# Patient Record
Sex: Male | Born: 1960 | Race: Black or African American | Hispanic: No | Marital: Married | Smoking: Current every day smoker
Health system: Southern US, Community
[De-identification: ages and names within clinical notes are randomized; demographics above are authoritative.]

## PROBLEM LIST (undated history)

## (undated) DIAGNOSIS — M1990 Unspecified osteoarthritis, unspecified site: Secondary | ICD-10-CM

## (undated) DIAGNOSIS — J189 Pneumonia, unspecified organism: Secondary | ICD-10-CM

## (undated) DIAGNOSIS — E785 Hyperlipidemia, unspecified: Secondary | ICD-10-CM

---

## 2010-03-14 HISTORY — PX: OTHER SURGICAL HISTORY: SHX169

## 2011-11-25 NOTE — H&P (Signed)
   Brent Garrett is an 51 y.o. male.    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 male complaining of left knee medial compartment pain since having an incident at work. Originally the pain was thought to be only from an ACL tear, which he did have repaired.  His pain continued and he had crunching on the medial aspect of the knee with certain motions.  Pain had continually increased since the beginning. X-rays in the clinic show end-stage arthritic changes of the medial compartment of the left knee. Pt has tried various conservative treatments which have failed to alleviate their symptoms, including injections and NSAIDs. Various options are discussed with the patient. Risks, benefits and expectations were discussed with the patient. Patient understand the risks, benefits and expectations and wishes to proceed with surgery.   PCP:  No primary provider on file.  D/C Plans:  Home with HHPT  Post-op Meds:   Rx given for ASA, Robaxin, Celebrex, Iron, Colace and MiraLax  Tranexamic Acid:   To be given  Decadron:   To be given  PMH: Recent episode of pneumonia which has resolved  PSH: No past surgical history on file.  Social History: Smokes tobacco 1 pack per day for 30 years, discussion is had regarding quitting and the adverse health effects of continues cigarette smoking. Occasional use of alcohol.  Allergies:  No know drug allergies  Medications: No current outpatient prescriptions  ROS: Review of Systems  Constitutional: Negative.   HENT: Negative.   Eyes: Negative.   Respiratory: Negative.   Cardiovascular: Negative.   Gastrointestinal: Negative.   Genitourinary: Negative.   Musculoskeletal: Positive for joint pain.  Skin: Negative.   Neurological: Negative.   Endo/Heme/Allergies: Positive for environmental allergies.  Psychiatric/Behavioral: Negative.      Physical Exam: BP:   119/80  ;  HR:   83  ; Resp:   16  ; Physical  Exam  Constitutional: He is oriented to person, place, and time and well-developed, well-nourished, and in no distress.  HENT:  Head: Normocephalic and atraumatic.  Nose: Nose normal.  Mouth/Throat: Oropharynx is clear and moist.  Eyes: Pupils are equal, round, and reactive to light.  Neck: Neck supple. No JVD present. No tracheal deviation present. No thyromegaly present.  Cardiovascular: Normal rate, regular rhythm, normal heart sounds and intact distal pulses.   Pulmonary/Chest: Effort normal and breath sounds normal. No stridor. No respiratory distress. He has no wheezes.  Abdominal: Soft. There is no tenderness. There is no guarding.  Musculoskeletal:       Left knee: He exhibits decreased range of motion, swelling and bony tenderness. He exhibits no effusion, no ecchymosis, no deformity, no laceration and no erythema. tenderness found. Medial joint line tenderness noted. No lateral joint line tenderness noted.  Lymphadenopathy:    He has no cervical adenopathy.  Neurological: He is alert and oriented to person, place, and time.  Skin: Skin is warm and dry.  Psychiatric: Affect normal.     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 Charlann Boxer at [facility]. Risks benefits and expectations were discussed with the patient. Patient understand risks, benefits and expectations and wishes to proceed.   Anastasio Auerbach Erice Ahles   PAC  11/25/2011, 2:06 PM

## 2011-11-29 ENCOUNTER — Encounter (HOSPITAL_COMMUNITY): Payer: Self-pay | Admitting: Pharmacy Technician

## 2011-12-02 ENCOUNTER — Inpatient Hospital Stay (HOSPITAL_COMMUNITY): Admission: RE | Admit: 2011-12-02 | Payer: Self-pay | Source: Ambulatory Visit

## 2012-01-04 NOTE — H&P (Signed)
  Brent Garrett is an 51 y.o. male.   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 male complaining of left knee medial compartment pain since having an incident at work. Originally the pain was thought to be only from an ACL tear, which he did have repaired. His pain continued and he had crunching on the medial aspect of the knee with certain motions. Pain had continually increased since the beginning. X-rays in the clinic show end-stage arthritic changes of the medial compartment of the left knee. Pt has tried various conservative treatments which have failed to alleviate their symptoms, including injections and NSAIDs. Various options are discussed with the patient. Risks, benefits and expectations were discussed with the patient. Patient understand the risks, benefits and expectations and wishes to proceed with surgery. Was originally suppose to have surgery a month ago, but because of the pneumonia episode it was rescheduled. He states nothing has changed with his knee and he is ready for surgery  PCP: No primary provider on file.   D/C Plans: Home with HHPT   Post-op Meds: Rx given for ASA, Robaxin, Celebrex, Iron, Colace and MiraLax   Tranexamic Acid: To be given   Decadron: To be given   PMH:  Episode of pneumonia over 1 month ago, which has resolved   PSH:  No past surgical history on file.   Social History: Smokes tobacco 1 pack per day for 30 years, discussion is had regarding quitting and the adverse health effects of continues cigarette smoking. Occasional use of alcohol.   Allergies:  No know drug allergies   Medications:  No current outpatient prescriptions   ROS:  Review of Systems  Constitutional: Negative.  HENT: Negative.  Eyes: Negative.  Respiratory: Negative.  Cardiovascular: Negative.  Gastrointestinal: Negative.  Genitourinary: Negative.  Musculoskeletal: Positive for joint pain.  Skin: Negative.  Neurological:  Negative.  Endo/Heme/Allergies: Positive for environmental allergies.  Psychiatric/Behavioral: Negative.    Physical Exam:  BP: 126/78 ; HR: 75 ; Resp: 16 ;  Physical Exam  Constitutional: He is oriented to person, place, and time and well-developed, well-nourished, and in no distress.  HENT:  Head: Normocephalic and atraumatic.  Nose: Nose normal.  Mouth/Throat: Oropharynx is clear and moist.  Eyes: Pupils are equal, round, and reactive to light.  Neck: Neck supple. No JVD present. No tracheal deviation present. No thyromegaly present.  Cardiovascular: Normal rate, regular rhythm, normal heart sounds and intact distal pulses.  Pulmonary/Chest: Effort normal and breath sounds normal. No stridor. No respiratory distress. He has no wheezes.  Abdominal: Soft. There is no tenderness. There is no guarding.  Musculoskeletal:  Left knee: He exhibits decreased range of motion, swelling and bony tenderness. He exhibits no effusion, no ecchymosis, no deformity, no laceration and no erythema. tenderness found. Medial joint line tenderness noted. No lateral joint line tenderness noted.  Lymphadenopathy:  He has no cervical adenopathy.  Neurological: He is alert and oriented to person, place, and time.  Skin: Skin is warm and dry.  Psychiatric: Affect normal.   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 Charlann Boxer at [facility]. Risks benefits and expectations were discussed with the patient. Patient understand risks, benefits and expectations and wishes to proceed.   Anastasio Auerbach Leia Coletti   PAC  01/04/2012, 4:38 PM

## 2012-01-05 ENCOUNTER — Encounter (HOSPITAL_COMMUNITY)
Admission: RE | Admit: 2012-01-05 | Discharge: 2012-01-05 | Disposition: A | Discharge status: Home / Self Care | Payer: Self-pay | Source: Ambulatory Visit | Attending: Orthopedic Surgery | Admitting: Orthopedic Surgery

## 2012-01-05 ENCOUNTER — Ambulatory Visit (HOSPITAL_COMMUNITY)
Admission: RE | Admit: 2012-01-05 | Discharge: 2012-01-05 | Disposition: A | Discharge status: Home / Self Care | Payer: Self-pay | Source: Ambulatory Visit | Attending: Orthopedic Surgery | Admitting: Orthopedic Surgery

## 2012-01-05 ENCOUNTER — Encounter (HOSPITAL_COMMUNITY): Payer: Self-pay

## 2012-01-05 DIAGNOSIS — IMO0002 Reserved for concepts with insufficient information to code with codable children: Secondary | ICD-10-CM | POA: Insufficient documentation

## 2012-01-05 DIAGNOSIS — Z01818 Encounter for other preprocedural examination: Secondary | ICD-10-CM | POA: Insufficient documentation

## 2012-01-05 DIAGNOSIS — Z01812 Encounter for preprocedural laboratory examination: Secondary | ICD-10-CM | POA: Insufficient documentation

## 2012-01-05 DIAGNOSIS — M1710 Unilateral primary osteoarthritis, unspecified knee: Secondary | ICD-10-CM | POA: Insufficient documentation

## 2012-01-05 HISTORY — DX: Pneumonia, unspecified organism: J18.9

## 2012-01-05 HISTORY — DX: Unspecified osteoarthritis, unspecified site: M19.90

## 2012-01-05 LAB — URINALYSIS, ROUTINE W REFLEX MICROSCOPIC
Bilirubin Urine: NEGATIVE
Nitrite: NEGATIVE
Protein, ur: NEGATIVE mg/dL
Specific Gravity, Urine: 1.018 (ref 1.005–1.030)
Urobilinogen, UA: 0.2 mg/dL (ref 0.0–1.0)

## 2012-01-05 LAB — CBC
Hemoglobin: 14.6 g/dL (ref 13.0–17.0)
MCHC: 33.9 g/dL (ref 30.0–36.0)
RBC: 5.45 MIL/uL (ref 4.22–5.81)

## 2012-01-05 LAB — URINE MICROSCOPIC-ADD ON

## 2012-01-05 LAB — SURGICAL PCR SCREEN
MRSA, PCR: NEGATIVE
Staphylococcus aureus: NEGATIVE

## 2012-01-05 LAB — BASIC METABOLIC PANEL
BUN: 11 mg/dL (ref 6–23)
GFR calc Af Amer: >90 mL/min (ref >90)
GFR calc non Af Amer: 81 mL/min — ABNORMAL LOW (ref >90)
Potassium: 4.3 mEq/L (ref 3.5–5.1)
Sodium: 138 mEq/L (ref 135–145)

## 2012-01-05 LAB — PROTIME-INR
INR: 0.95 (ref 0.00–1.49)
Prothrombin Time: 12.6 seconds (ref 11.6–15.2)

## 2012-01-05 NOTE — Pre-Procedure Instructions (Signed)
PREOP CBC, BMET, PT, PTT, UA, CXR WERE DONE TODAY AT [facility] AS PER ORDERS DR. OLIN AND ANESTHESIOLOGIST.  PT HAD PNEUMONIA SEPT 2013 AND HAD NOT HAD FOLLOW UP CXR-BUT REPORTS FEELING OK NOW-NO FEVER OR COUGH.  PT WANTS TO WAIT UNTIL DAY OF SURGERY TO DO T/S-DID NOT WANT TO WEAR BLOOD BRACELET.

## 2012-01-05 NOTE — Pre-Procedure Instructions (Signed)
PT'S PREOP  ABNORMAL URINALYSIS AND URINE MICROSCOPIC REPORTS FAXED TO DR. Nilsa Nutting OFFICE VIA EP[employer] FAX.

## 2012-01-05 NOTE — Patient Instructions (Signed)
YOUR SURGERY IS SCHEDULED AT [facility]  ON:  Tuesday  10/29  AT 10:30 AM  REPORT TO [facility] AT:  8:00 AM      PHONE # FOR SHORT STAY IS [phone]  DO NOT EAT OR DRINK ANYTHING AFTER MIDNIGHT THE NIGHT BEFORE YOUR SURGERY.  YOU MAY BRUSH YOUR TEETH, RINSE OUT YOUR MOUTH--BUT NO WATER, NO FOOD, NO CHEWING GUM, NO MINTS, NO CANDIES, NO CHEWING TOBACCO.  PLEASE TAKE THE FOLLOWING MEDICATIONS THE AM OF YOUR SURGERY WITH A FEW SIPS OF WATER:  NO MEDICINES TO TAKE   IF YOU USE INHALERS--USE YOUR INHALERS THE AM OF YOUR SURGERY AND BRING INHALERS TO THE HOSPITAL -TAKE TO SURGERY.    IF YOU ARE DIABETIC:  DO NOT TAKE ANY DIABETIC MEDICATIONS THE AM OF YOUR SURGERY.  IF YOU TAKE INSULIN IN THE EVENINGS--PLEASE ONLY TAKE 1/2 NORMAL EVENING DOSE THE NIGHT BEFORE YOUR SURGERY.  NO INSULIN THE AM OF YOUR SURGERY.  IF YOU HAVE SLEEP APNEA AND USE CPAP OR BIPAP--PLEASE BRING THE MASK AND THE TUBING.  DO NOT BRING YOUR MACHINE.  DO NOT BRING VALUABLES, MONEY, CREDIT CARDS.  DO NOT WEAR JEWELRY, MAKE-UP, NAIL POLISH AND NO METAL PINS OR CLIPS IN YOUR HAIR. CONTACT LENS, DENTURES / PARTIALS, GLASSES SHOULD NOT BE WORN TO SURGERY AND IN MOST CASES-HEARING AIDS WILL NEED TO BE REMOVED.  BRING YOUR GLASSES CASE, ANY EQUIPMENT NEEDED FOR YOUR CONTACT LENS. FOR PATIENTS ADMITTED TO THE HOSPITAL--CHECK OUT TIME THE DAY OF DISCHARGE IS 11:00 AM.  ALL INPATIENT ROOMS ARE PRIVATE - WITH BATHROOM, TELEPHONE, TELEVISION AND WIFI INTERNET.  IF YOU ARE BEING DISCHARGED THE SAME DAY OF YOUR SURGERY--YOU CAN NOT DRIVE YOURSELF HOME--AND SHOULD NOT GO HOME ALONE BY TAXI OR BUS.  NO DRIVING OR OPERATING MACHINERY FOR 24 HOURS FOLLOWING ANESTHESIA / PAIN MEDICATIONS.  PLEASE MAKE ARRANGEMENTS FOR SOMEONE TO BE WITH YOU AT HOME THE FIRST 24 HOURS AFTER SURGERY. RESPONSIBLE DRIVER'S NAME___________________________                                               PHONE #   _______________________                                    PLEASE READ OVER ANY  FACT SHEETS THAT YOU WERE GIVEN: MRSA INFORMATION, BLOOD TRANSFUSION INFORMATION, INCENTIVE SPIROMETER INFORMATION.

## 2012-01-10 ENCOUNTER — Ambulatory Visit (HOSPITAL_COMMUNITY): Payer: Worker's Compensation | Admitting: Anesthesiology

## 2012-01-10 ENCOUNTER — Observation Stay (HOSPITAL_COMMUNITY)
Admission: RE | Admit: 2012-01-10 | Discharge: 2012-01-11 | Disposition: A | Discharge status: Home / Self Care | Payer: Worker's Compensation | Source: Ambulatory Visit | Attending: Orthopedic Surgery | Admitting: Orthopedic Surgery

## 2012-01-10 ENCOUNTER — Encounter (HOSPITAL_COMMUNITY): Payer: Self-pay | Admitting: *Deleted

## 2012-01-10 ENCOUNTER — Encounter (HOSPITAL_COMMUNITY)
Admission: RE | Disposition: A | Discharge status: Home / Self Care | Payer: Self-pay | Source: Ambulatory Visit | Attending: Orthopedic Surgery

## 2012-01-10 ENCOUNTER — Encounter (HOSPITAL_COMMUNITY): Payer: Self-pay | Admitting: Anesthesiology

## 2012-01-10 DIAGNOSIS — E871 Hypo-osmolality and hyponatremia: Secondary | ICD-10-CM

## 2012-01-10 DIAGNOSIS — M25569 Pain in unspecified knee: Secondary | ICD-10-CM | POA: Insufficient documentation

## 2012-01-10 DIAGNOSIS — Z96652 Presence of left artificial knee joint: Secondary | ICD-10-CM

## 2012-01-10 DIAGNOSIS — M1710 Unilateral primary osteoarthritis, unspecified knee: Principal | ICD-10-CM | POA: Insufficient documentation

## 2012-01-10 DIAGNOSIS — Z8701 Personal history of pneumonia (recurrent): Secondary | ICD-10-CM | POA: Insufficient documentation

## 2012-01-10 DIAGNOSIS — F17200 Nicotine dependence, unspecified, uncomplicated: Secondary | ICD-10-CM | POA: Insufficient documentation

## 2012-01-10 HISTORY — PX: PARTIAL KNEE ARTHROPLASTY: SHX2174

## 2012-01-10 LAB — TYPE AND SCREEN: ABO/RH(D): O POS

## 2012-01-10 LAB — ABO/RH: ABO/RH(D): O POS

## 2012-01-10 SURGERY — ARTHROPLASTY, KNEE, UNICOMPARTMENTAL
Anesthesia: Spinal | Site: Knee | Laterality: Left | Wound class: Clean

## 2012-01-10 MED ORDER — METOCLOPRAMIDE HCL 5 MG/ML IJ SOLN: 5.0000 mg | Freq: Three times a day (TID) | INTRAMUSCULAR | Status: DC | PRN

## 2012-01-10 MED ORDER — KETOROLAC TROMETHAMINE 30 MG/ML IJ SOLN
INTRAMUSCULAR | Status: DC | PRN
  Administered 2012-01-10: 30 mg via INTRAMUSCULAR

## 2012-01-10 MED ORDER — HYDROMORPHONE HCL PF 1 MG/ML IJ SOLN
0.5000 mg | INTRAMUSCULAR | Status: DC | PRN
  Administered 2012-01-10: 1 mg via INTRAVENOUS
  Filled 2012-01-10: qty 1

## 2012-01-10 MED ORDER — BUPIVACAINE-EPINEPHRINE 0.25% -1:200000 IJ SOLN
INTRAMUSCULAR | Status: DC | PRN
  Administered 2012-01-10: 30 mL

## 2012-01-10 MED ORDER — LACTATED RINGERS IV SOLN: INTRAVENOUS | Status: DC

## 2012-01-10 MED ORDER — MAGNESIUM CITRATE PO SOLN: 0.5000 | Freq: Once | ORAL | Status: AC | PRN

## 2012-01-10 MED ORDER — LACTATED RINGERS IV SOLN
INTRAVENOUS | Status: DC | PRN
  Administered 2012-01-10 (×3): via INTRAVENOUS

## 2012-01-10 MED ORDER — HYDROMORPHONE HCL PF 1 MG/ML IJ SOLN: 0.2500 mg | INTRAMUSCULAR | Status: DC | PRN

## 2012-01-10 MED ORDER — METHOCARBAMOL 500 MG PO TABS
500.0000 mg | ORAL_TABLET | Freq: Four times a day (QID) | ORAL | Status: DC | PRN
  Administered 2012-01-10: 500 mg via ORAL

## 2012-01-10 MED ORDER — ACETAMINOPHEN 650 MG RE SUPP: 650.0000 mg | Freq: Four times a day (QID) | RECTAL | Status: DC | PRN

## 2012-01-10 MED ORDER — CEFAZOLIN SODIUM-DEXTROSE 2-3 GM-% IV SOLR
2.0000 g | Freq: Four times a day (QID) | INTRAVENOUS | Status: AC
  Administered 2012-01-10 (×2): 2 g via INTRAVENOUS
  Filled 2012-01-10 (×2): qty 50

## 2012-01-10 MED ORDER — ACETAMINOPHEN 10 MG/ML IV SOLN
INTRAVENOUS | Status: DC | PRN
  Administered 2012-01-10: 1000 mg via INTRAVENOUS

## 2012-01-10 MED ORDER — HYDROCODONE-ACETAMINOPHEN 7.5-325 MG PO TABS
1.0000 | ORAL_TABLET | Freq: Four times a day (QID) | ORAL | Status: DC
  Administered 2012-01-10 – 2012-01-11 (×5): 1 via ORAL
  Filled 2012-01-10: qty 1
  Filled 2012-01-10: qty 2
  Filled 2012-01-10 (×3): qty 1

## 2012-01-10 MED ORDER — 0.9 % SODIUM CHLORIDE (POUR BTL) OPTIME
TOPICAL | Status: DC | PRN
  Administered 2012-01-10: 1000 mL

## 2012-01-10 MED ORDER — FENTANYL CITRATE 0.05 MG/ML IJ SOLN
INTRAMUSCULAR | Status: DC | PRN
  Administered 2012-01-10: 100 ug via INTRAVENOUS

## 2012-01-10 MED ORDER — DOCUSATE SODIUM 100 MG PO CAPS
100.0000 mg | ORAL_CAPSULE | Freq: Two times a day (BID) | ORAL | Status: DC
  Administered 2012-01-10 – 2012-01-11 (×3): 100 mg via ORAL

## 2012-01-10 MED ORDER — ACETAMINOPHEN 10 MG/ML IV SOLN
INTRAVENOUS | Status: AC
  Filled 2012-01-10: qty 100

## 2012-01-10 MED ORDER — DEXAMETHASONE SODIUM PHOSPHATE 10 MG/ML IJ SOLN
10.0000 mg | Freq: Once | INTRAMUSCULAR | Status: AC
  Administered 2012-01-10: 10 mg via INTRAVENOUS

## 2012-01-10 MED ORDER — PROMETHAZINE HCL 25 MG/ML IJ SOLN: 6.2500 mg | INTRAMUSCULAR | Status: DC | PRN

## 2012-01-10 MED ORDER — CELECOXIB 200 MG PO CAPS
200.0000 mg | ORAL_CAPSULE | Freq: Two times a day (BID) | ORAL | Status: DC
  Administered 2012-01-10 – 2012-01-11 (×3): 200 mg via ORAL
  Filled 2012-01-10 (×4): qty 1

## 2012-01-10 MED ORDER — BUPIVACAINE IN DEXTROSE 0.75-8.25 % IT SOLN
INTRATHECAL | Status: DC | PRN
  Administered 2012-01-10: 2 mL via INTRATHECAL

## 2012-01-10 MED ORDER — ONDANSETRON HCL 4 MG/2ML IJ SOLN: 4.0000 mg | Freq: Four times a day (QID) | INTRAMUSCULAR | Status: DC | PRN

## 2012-01-10 MED ORDER — BUPIVACAINE-EPINEPHRINE PF 0.25-1:200000 % IJ SOLN
INTRAMUSCULAR | Status: AC
  Filled 2012-01-10: qty 30

## 2012-01-10 MED ORDER — METOCLOPRAMIDE HCL 5 MG PO TABS
5.0000 mg | ORAL_TABLET | Freq: Three times a day (TID) | ORAL | Status: DC | PRN
  Filled 2012-01-10: qty 2

## 2012-01-10 MED ORDER — DEXAMETHASONE SODIUM PHOSPHATE 10 MG/ML IJ SOLN
10.0000 mg | Freq: Once | INTRAMUSCULAR | Status: AC
  Administered 2012-01-11: 10 mg via INTRAVENOUS
  Filled 2012-01-10: qty 1

## 2012-01-10 MED ORDER — ONDANSETRON HCL 4 MG/2ML IJ SOLN
INTRAMUSCULAR | Status: DC | PRN
  Administered 2012-01-10: 4 mg via INTRAVENOUS

## 2012-01-10 MED ORDER — CEFAZOLIN SODIUM-DEXTROSE 2-3 GM-% IV SOLR
2.0000 g | INTRAVENOUS | Status: AC
  Administered 2012-01-10: 2 g via INTRAVENOUS

## 2012-01-10 MED ORDER — ONDANSETRON HCL 4 MG PO TABS: 4.0000 mg | ORAL_TABLET | Freq: Four times a day (QID) | ORAL | Status: DC | PRN

## 2012-01-10 MED ORDER — POLYETHYLENE GLYCOL 3350 17 G PO PACK
17.0000 g | PACK | Freq: Two times a day (BID) | ORAL | Status: DC
  Administered 2012-01-10 – 2012-01-11 (×3): 17 g via ORAL

## 2012-01-10 MED ORDER — MIDAZOLAM HCL 5 MG/5ML IJ SOLN
INTRAMUSCULAR | Status: DC | PRN
  Administered 2012-01-10: 2 mg via INTRAVENOUS

## 2012-01-10 MED ORDER — MENTHOL 3 MG MT LOZG: 1.0000 | LOZENGE | OROMUCOSAL | Status: DC | PRN

## 2012-01-10 MED ORDER — POTASSIUM CHLORIDE 2 MEQ/ML IV SOLN
INTRAVENOUS | Status: DC
  Administered 2012-01-10 – 2012-01-11 (×2): via INTRAVENOUS
  Filled 2012-01-10 (×7): qty 1000

## 2012-01-10 MED ORDER — RIVAROXABAN 10 MG PO TABS
10.0000 mg | ORAL_TABLET | Freq: Every day | ORAL | Status: DC
  Administered 2012-01-11: 10 mg via ORAL
  Filled 2012-01-10 (×2): qty 1

## 2012-01-10 MED ORDER — ACETAMINOPHEN 325 MG PO TABS: 650.0000 mg | ORAL_TABLET | Freq: Four times a day (QID) | ORAL | Status: DC | PRN

## 2012-01-10 MED ORDER — CHLORHEXIDINE GLUCONATE 4 % EX LIQD
60.0000 mL | Freq: Once | CUTANEOUS | Status: DC
  Filled 2012-01-10: qty 60

## 2012-01-10 MED ORDER — PHENOL 1.4 % MT LIQD: 1.0000 | OROMUCOSAL | Status: DC | PRN

## 2012-01-10 MED ORDER — PROPOFOL INFUSION 10 MG/ML OPTIME
INTRAVENOUS | Status: DC | PRN
  Administered 2012-01-10: 100 ug/kg/min via INTRAVENOUS

## 2012-01-10 MED ORDER — KETOROLAC TROMETHAMINE 30 MG/ML IJ SOLN
INTRAMUSCULAR | Status: AC
  Filled 2012-01-10: qty 1

## 2012-01-10 MED ORDER — CEFAZOLIN SODIUM-DEXTROSE 2-3 GM-% IV SOLR
INTRAVENOUS | Status: AC
  Filled 2012-01-10: qty 50

## 2012-01-10 MED ORDER — METHOCARBAMOL 100 MG/ML IJ SOLN
500.0000 mg | Freq: Four times a day (QID) | INTRAVENOUS | Status: DC | PRN
  Filled 2012-01-10: qty 5

## 2012-01-10 MED ORDER — TRANEXAMIC ACID 100 MG/ML IV SOLN
1150.0000 mg | Freq: Once | INTRAVENOUS | Status: AC
  Administered 2012-01-10 (×2): 1150 mg via INTRAVENOUS
  Filled 2012-01-10: qty 11.5

## 2012-01-10 SURGICAL SUPPLY — 47 items
BAG ZIPLOCK 12X15 (MISCELLANEOUS) ×2 IMPLANT
BANDAGE ELASTIC 6 VELCRO ST LF (GAUZE/BANDAGES/DRESSINGS) ×2 IMPLANT
BANDAGE ESMARK 6X9 LF (GAUZE/BANDAGES/DRESSINGS) ×1 IMPLANT
BLADE SAW RECIPROCATING 77.5 (BLADE) ×2 IMPLANT
BLADE SAW SGTL 13.0X1.19X90.0M (BLADE) ×2 IMPLANT
BNDG ESMARK 6X9 LF (GAUZE/BANDAGES/DRESSINGS) ×2
BOWL SMART MIX CTS (DISPOSABLE) ×2 IMPLANT
CEMENT HV SMART SET (Cement) ×2 IMPLANT
CLOTH BEACON ORANGE TIMEOUT ST (SAFETY) ×2 IMPLANT
COVER SURGICAL LIGHT HANDLE (MISCELLANEOUS) ×2 IMPLANT
CUFF TOURN SGL QUICK 34 (TOURNIQUET CUFF) ×1
CUFF TRNQT CYL 34X4X40X1 (TOURNIQUET CUFF) ×1 IMPLANT
DERMABOND ADVANCED (GAUZE/BANDAGES/DRESSINGS) ×1
DERMABOND ADVANCED .7 DNX12 (GAUZE/BANDAGES/DRESSINGS) ×1 IMPLANT
DRAPE EXTREMITY T 121X128X90 (DRAPE) ×2 IMPLANT
DRAPE POUCH INSTRU U-SHP 10X18 (DRAPES) ×2 IMPLANT
DRSG AQUACEL AG ADV 3.5X 6 (GAUZE/BANDAGES/DRESSINGS) ×2 IMPLANT
DRSG TEGADERM 4X4.75 (GAUZE/BANDAGES/DRESSINGS) ×2 IMPLANT
DURAPREP 26ML APPLICATOR (WOUND CARE) ×2 IMPLANT
ELECT REM PT RETURN 9FT ADLT (ELECTROSURGICAL) ×2
ELECTRODE REM PT RTRN 9FT ADLT (ELECTROSURGICAL) ×1 IMPLANT
EVACUATOR 1/8 PVC DRAIN (DRAIN) ×2 IMPLANT
FACESHIELD LNG OPTICON STERILE (SAFETY) ×8 IMPLANT
GAUZE SPONGE 2X2 8PLY STRL LF (GAUZE/BANDAGES/DRESSINGS) ×1 IMPLANT
GLOVE BIOGEL PI IND STRL 7.5 (GLOVE) ×1 IMPLANT
GLOVE BIOGEL PI IND STRL 8 (GLOVE) IMPLANT
GLOVE BIOGEL PI INDICATOR 7.5 (GLOVE) ×1
GLOVE BIOGEL PI INDICATOR 8 (GLOVE)
GLOVE ORTHO TXT STRL SZ7.5 (GLOVE) ×4 IMPLANT
GOWN BRE IMP PREV XXLGXLNG (GOWN DISPOSABLE) ×4 IMPLANT
GOWN STRL NON-REIN LRG LVL3 (GOWN DISPOSABLE) ×2 IMPLANT
KIT BASIN OR (CUSTOM PROCEDURE TRAY) ×2 IMPLANT
LEGGING LITHOTOMY PAIR STRL (DRAPES) ×2 IMPLANT
MANIFOLD NEPTUNE II (INSTRUMENTS) ×2 IMPLANT
NDL SAFETY ECLIPSE 18X1.5 (NEEDLE) ×1 IMPLANT
NEEDLE HYPO 18GX1.5 SHARP (NEEDLE) ×1
PACK TOTAL JOINT (CUSTOM PROCEDURE TRAY) ×2 IMPLANT
POSITIONER SURGICAL ARM (MISCELLANEOUS) ×2 IMPLANT
SPONGE GAUZE 2X2 STER 10/PKG (GAUZE/BANDAGES/DRESSINGS) ×1
SUCTION FRAZIER TIP 10 FR DISP (SUCTIONS) ×2 IMPLANT
SUT MNCRL AB 4-0 PS2 18 (SUTURE) ×2 IMPLANT
SUT VIC AB 1 CT1 36 (SUTURE) ×4 IMPLANT
SUT VIC AB 2-0 CT1 27 (SUTURE) ×2
SUT VIC AB 2-0 CT1 TAPERPNT 27 (SUTURE) ×2 IMPLANT
SYR 50ML LL SCALE MARK (SYRINGE) ×2 IMPLANT
TOWEL OR 17X26 10 PK STRL BLUE (TOWEL DISPOSABLE) ×4 IMPLANT
TRAY FOLEY CATH 14FRSI W/METER (CATHETERS) ×2 IMPLANT

## 2012-01-10 NOTE — Preoperative (Signed)
Beta Blockers   Reason not to administer Beta Blockers:Not Applicable 

## 2012-01-10 NOTE — Anesthesia Procedure Notes (Signed)
Spinal  Patient location during procedure: OR Start time: 01/10/2012 10:05 AM End time: 01/10/2012 10:10 AM Staffing Anesthesiologist: Lucille Passy F Performed by: anesthesiologist  Preanesthetic Checklist Completed: patient identified, site marked, surgical consent, pre-op evaluation, timeout performed, IV checked, risks and benefits discussed and monitors and equipment checked Spinal Block Patient position: sitting Prep: Betadine Patient monitoring: heart rate, continuous pulse ox and blood pressure Injection technique: single-shot Needle Needle type: Sprotte  Needle gauge: 24 G Needle length: 9 cm Additional Notes Expiration date of kit checked and confirmed. Patient tolerated procedure well, without complications. Negative heme/paresthesia Lot [id number] DOE 02/2013

## 2012-01-10 NOTE — Anesthesia Postprocedure Evaluation (Signed)
Anesthesia Post Note  Patient: Brent Garrett  Procedure(s) Performed: Procedure(s) (LRB): UNICOMPARTMENTAL KNEE (Left)  Anesthesia type: Spinal  Patient location: PACU  Post pain: Pain level controlled  Post assessment: Post-op Vital signs reviewed  Last Vitals:  Filed Vitals:   01/10/12 1215  BP: 103/72  Pulse: 59  Temp:   Resp: 16    Post vital signs: Reviewed  Level of consciousness: sedated  Complications: No apparent anesthesia complications

## 2012-01-10 NOTE — Interval H&P Note (Signed)
History and Physical Interval Note:  01/10/2012 8:50 AM  Brent Garrett  has presented today for surgery, with the diagnosis of left knee medial compartmental osteoarthritis  The various methods of treatment have been discussed with the patient and family. After consideration of risks, benefits and other options for treatment, the patient has consented to  Procedure(s) (LRB) with comments: UNICOMPARTMENTAL KNEE (Left) - Left Knee Medial Unicompartmental as a surgical intervention .  The patient's history has been reviewed, patient examined, no change in status, stable for surgery.  I have reviewed the patient's chart and labs.  Questions were answered to the patient's satisfaction.     Shelda Pal

## 2012-01-10 NOTE — Evaluation (Signed)
Physical Therapy Evaluation Patient Details Name: Brent Garrett MRN: [id number] DOB: 29-Dec-1960 Today's Date: 01/10/2012 Time: 7829-5621 PT Time Calculation (min): 24 min  PT Assessment / Plan / Recommendation Clinical Impression  Pt presents s/p L UKR POD 0 with decreased strength, ROM and mobility.  Tolerated OOB and ambulation in hallway very well with little increase in pain and no c/o dizziness/light headedness.  Pt will benefit from skilled PT in acute venue to address deficits.  PT recommends HHPT for follow up at D/C to maximize pt independence.      PT Assessment  Patient needs continued PT services    Follow Up Recommendations  Home health PT;Supervision - Intermittent    Does the patient have the potential to tolerate intense rehabilitation      Barriers to Discharge Decreased caregiver support wife works two jobs, state they are going to try and get someone to help at home.     Equipment Recommendations  Rolling walker with 5" wheels    Recommendations for Other Services OT consult   Frequency 7X/week    Precautions / Restrictions Precautions Precautions: Knee Required Braces or Orthoses: Knee Immobilizer - Left Knee Immobilizer - Left: Discontinue once straight leg raise with < 10 degree lag Restrictions Weight Bearing Restrictions: No Other Position/Activity Restrictions: WBAT   Pertinent Vitals/Pain 2/10       Mobility  Bed Mobility Bed Mobility: Supine to Sit;Sitting - Scoot to Edge of Bed Supine to Sit: 5: Supervision Sitting - Scoot to Edge of Bed: 6: Modified independent (Device/Increase time) Details for Bed Mobility Assistance: Supervision for safety with min cues for hand placement/safety.  Transfers Transfers: Sit to Stand;Stand to Sit Sit to Stand: 5: Supervision;From elevated surface;With upper extremity assist;From bed Stand to Sit: 5: Supervision;With upper extremity assist;With armrests;To chair/3-in-1 Details for Transfer Assistance:  min cues for hand placement and safety.  Ambulation/Gait Ambulation/Gait Assistance: 4: Min guard Ambulation Distance (Feet): 50 Feet Assistive device: Rolling walker Ambulation/Gait Assistance Details: Min cues for sequencing/technique with RW and to maintain upright posture.  Gait Pattern: Step-to pattern;Decreased stride length Gait velocity: decreased Stairs: No Wheelchair Mobility Wheelchair Mobility: No    Shoulder Instructions     Exercises     PT Diagnosis: Difficulty walking;Acute pain  PT Problem List: Decreased strength;Decreased range of motion;Decreased balance;Decreased mobility;Decreased knowledge of use of DME;Pain PT Treatment Interventions: Gait training;DME instruction;Functional mobility training;Stair training;Therapeutic activities;Therapeutic exercise;Balance training;Patient/family education   PT Goals Acute Rehab PT Goals PT Goal Formulation: With patient Time For Goal Achievement: 01/12/12 Potential to Achieve Goals: Good Pt will Ambulate: >150 feet;with least restrictive assistive device;with supervision PT Goal: Ambulate - Progress: Goal set today Pt will Go Up / Down Stairs: 1-2 stairs;with supervision;with least restrictive assistive device PT Goal: Up/Down Stairs - Progress: Goal set today Pt will Perform Home Exercise Program: with supervision, verbal cues required/provided PT Goal: Perform Home Exercise Program - Progress: Goal set today  Visit Information  Last PT Received On: 01/10/12 Assistance Needed: +1    Subjective Data  Subjective: I wanna get up Patient Stated Goal: to go home   Prior Functioning  Home Living Lives With: Spouse Available Help at Discharge: Available 24 hours/day Type of Home: House Home Access: Stairs to enter [employer] of Steps: 1 Entrance Stairs-Rails: None Home Layout: One level Bathroom Shower/Tub: Engineer, manufacturing systems: Standard Home Adaptive Equipment: None Prior Function Level  of Independence: Independent Able to Take Stairs?: Yes Driving: Yes Vocation: On disability Communication Communication: No  difficulties    Cognition  Overall Cognitive Status: Appears within functional limits for tasks assessed/performed Arousal/Alertness: Awake/alert Orientation Level: Appears intact for tasks assessed Behavior During Session: [facility] for tasks performed    Extremity/Trunk Assessment Right Lower Extremity Assessment RLE ROM/Strength/Tone: [facility] for tasks assessed RLE Sensation: WFL - Light Touch RLE Coordination: WFL - gross/fine motor Left Lower Extremity Assessment LLE ROM/Strength/Tone: Deficits LLE ROM/Strength/Tone Deficits: ankle motions, able to perform SLR without ext lag, therefore went without knee immobilizer.  LLE Sensation: WFL - Light Touch LLE Coordination: WFL - gross/fine motor Trunk Assessment Trunk Assessment: Normal   Balance    End of Session PT - End of Session Activity Tolerance: Patient tolerated treatment well Patient left: in chair;with call bell/phone within reach;with family/visitor present Nurse Communication: Mobility status  GP     Page, Meribeth Mattes 01/10/2012, 5:25 PM

## 2012-01-10 NOTE — Transfer of Care (Signed)
Immediate Anesthesia Transfer of Care Note  Patient: Brent Garrett  Procedure(s) Performed: Procedure(s) (LRB) with comments: UNICOMPARTMENTAL KNEE (Left) - Left Knee Medial Unicompartmental  Patient Location: PACU  Anesthesia Type:Spinal  Level of Consciousness: awake, alert , oriented and patient cooperative  Airway & Oxygen Therapy: Patient Spontanous Breathing and Patient connected to face mask oxygen  Post-op Assessment: Report given to PACU RN and Post -op Vital signs reviewed and stable  Post vital signs: Reviewed and stable  Complications: No apparent anesthesia complications

## 2012-01-10 NOTE — Op Note (Signed)
NAME: Brent Garrett    MEDICAL RECORD NO.: [id number]   FACILITY: [facility]   DATE OF BIRTH: July 09, 1960  PHYSICIAN: Madlyn Frankel. Charlann Boxer, M.D.    DATE OF PROCEDURE: 01/10/2012    OPERATIVE REPORT   PREOPERATIVE DIAGNOSIS: Left knee medial compartment osteoarthritis.   POSTOPERATIVE DIAGNOSIS: Left knee medial compartment osteoarthritis.  PROCEDURE: Left partial knee replacement utilizing Biomet Oxford knee  component, size medium femur, a left medial size C tibial tray with a size 4 insert.   SURGEON: Madlyn Frankel. Charlann Boxer, M.D.   ASSISTANT: Lanney Gins, PAC.  Please note that Mr. Brent Garrett was present for the entirety of the case,  utilized for preoperative positioning, perioperative retractor  management, general facilitation of the case and primary wound closure.   ANESTHESIA: Spinal.   SPECIMENS: None.   COMPLICATIONS: None.  DRAINS: 1 medium HV   TOURNIQUET TIME: 44 minutes at 250 mmHg.   INDICATIONS FOR PROCEDURE: The patient is a 51 yo patient of mine who presented for evaluation of left knee pain.  They presented with primary complaints of pain on the medial side of their knee. Radiographs revealed advanced medial compartment arthritis with specifically an antero-medial wear pattern.  There was bone on bone changes noted with subchondral sclerosis and osteophytes present. The patient has had progressive problems failing to respond to conservative measures of medications, injections and activity modification. Risks of infection, DVT, component failure, need for future revision surgery were all discussed and reviewed.  Consent was obtained for benefit of pain relief.   PROCEDURE IN DETAIL: The patient was brought to the operative theater.  Once adequate anesthesia, preoperative antibiotics, 2gm Ancef administered, the patient was positioned in supine position with a left thigh tourniquet  placed. The left lower extremity was prepped and draped in sterile  fashion with the leg on  the Oxford leg holder.  The leg was allowed to flex to 120 degrees. A time-out  was performed identifying the patient, planned procedure, and extremity.  The leg was exsanguinated, tourniquet elevated to 250 mmHg. A midline  incision was made from the proximal pole of the patella to the tibial tubercle. A  soft tissue plane was created and partial median arthrotomy was then  made to allow for subluxation of the patella. Following initial synovectomy and  debridement, the osteophytes were removed off the medial aspect of the  knee.   Attention was first directed to the tibia. The tibial  extramedullary guide was positioned over the anterior crest of the tibia  and pinned into position, and using a measured resection guide from the  Oxford system, a 4 mm resection was made off the proximal tibia. First  the reciprocating saw along the medial aspect of the tibial spines, then the oscillating saw.    At this point, I sized this cut surface seem to be best fit for a size C tibial tray.  With the retractors out of the wound and the knee held at 90 degrees the 4 feeler gauge had appropriate tension on the medial ligament.   At this point, the femoral canal was opened with a drill and the  intramedullary rod passed. Then using the guide for a medium femoral resection off  the posterior aspect of the femur was positioned over the mid portion of the medial femoral condyle.  The orientation was set using the guide that mates the femoral guide to the intramedullary rod.  The 2 drill holes were made into the distal femur.  The posterior guide  was then impacted into place and the posterior  femoral cut made.  At this point, I milled the distal femur with a size 4 spigot in place. At this point, we did a trial reduction of the medium femur, size C tibial tray and a 4 feeler gauge insert. At 90 degrees of  flexion and at 20 degrees of flexion the knee had symmetric tension on  the ligaments.   Given  these findings, the trial femoral component was removed. Final preparation of tibia was carried out by pinning it in position. Then  using a reciprocating saw I removed bone for the keel. Further bone was  removed with an osteotome.  Trial reduction was now carried out with the medium femur, the keeled C tibia, and a 4 lollipop insert. The balance of the  ligaments appeared to be symmetric at 20 degrees and 90 degrees. Given  all these findings, the trial components were removed.   Cement was mixed. The final components were opened. The knee was irrigated with  normal saline solution. Then final debridements of the  soft tissue was carried out, I also drilled the sclerotic bone with a drill.  The final components were cemented with a single batch of cement in a  two-stage technique with the tibial component cemented first. The knee  was then brought  to 45 degrees of flexion with a 4 feeler gauge, held with pressure for a minute and half.  After this the femoral component was cemented in place.  The knee was again held at 45 degrees of flexion while the cement fully cured.  Excess cement was removed throughout the knee. Tourniquet was let down  after 44 minutes. After the cement had fully cured and excessive cement  was removed throughout the knee there was no visualized cement present.   The final size 4 insert was chosen and snapped into position. We re-irrigated  the knee. I placed a medium Hemovac drain deep. The extensor mechanism  was then reapproximated using a #1 Vicryl with the knee in flexion. The  remaining wound was closed with 2-0 Vicryl and a running 4-0 Monocryl.  The knee was cleaned, dried, and dressed sterilely using Dermabond and  Aquacel dressing. The drain site was dressed separately. The patient  was brought to the recovery room, Ace wrap in place, tolerating the  procedure well. He will be in the hospital for overnight observation.  We will initiate physical therapy  and progress to ambulate.     Madlyn Frankel Charlann Boxer, M.D.

## 2012-01-10 NOTE — Anesthesia Preprocedure Evaluation (Signed)
Anesthesia Evaluation  Patient identified by MRN, date of birth, ID band Patient awake    Reviewed: Allergy & Precautions, H&P , NPO status , Patient's Chart, lab work & pertinent test results  Airway Mallampati: II TM Distance: >3 FB Neck ROM: Full    Dental  (+) Teeth Intact and Caps,    Pulmonary pneumonia -, resolved, Current Smoker,  breath sounds clear to auscultation  Pulmonary exam normal       Cardiovascular negative cardio ROS  Rhythm:Regular Rate:Normal     Neuro/Psych negative neurological ROS  negative psych ROS   GI/Hepatic negative GI ROS, Neg liver ROS,   Endo/Other  negative endocrine ROS  Renal/GU negative Renal ROS  negative genitourinary   Musculoskeletal negative musculoskeletal ROS (+)   Abdominal   Peds  Hematology negative hematology ROS (+)   Anesthesia Other Findings   Reproductive/Obstetrics negative OB ROS                           Anesthesia Physical Anesthesia Plan  ASA: I  Anesthesia Plan: Spinal   Post-op Pain Management:    Induction: Intravenous  Airway Management Planned: Simple Face Mask  Additional Equipment:   Intra-op Plan:   Post-operative Plan:   Informed Consent: I have reviewed the patients History and Physical, chart, labs and discussed the procedure including the risks, benefits and alternatives for the proposed anesthesia with the patient or authorized representative who has indicated his/her understanding and acceptance.   Dental advisory given  Plan Discussed with: CRNA  Anesthesia Plan Comments:         Anesthesia Quick Evaluation

## 2012-01-10 NOTE — Interval H&P Note (Signed)
History and Physical Interval Note:  01/10/2012 8:50 AM  Brent Garrett  has presented today for surgery, with the diagnosis of left knee medial compartmental osteoarthritis  The various methods of treatment have been discussed with the patient and family. After consideration of risks, benefits and other options for treatment, the patient has consented to  Procedure(s) (LRB) with comments: UNICOMPARTMENTAL KNEE (Left) - Left Knee Medial Unicompartmental as a surgical intervention .  The patient's history has been reviewed, patient examined, no change in status, stable for surgery.  I have reviewed the patient's chart and labs.  Questions were answered to the patient's satisfaction.     Hadia Minier D   

## 2012-01-11 ENCOUNTER — Encounter (HOSPITAL_COMMUNITY): Payer: Self-pay | Admitting: Orthopedic Surgery

## 2012-01-11 DIAGNOSIS — E871 Hypo-osmolality and hyponatremia: Secondary | ICD-10-CM

## 2012-01-11 LAB — CBC
HCT: 40.2 % (ref 39.0–52.0)
MCH: 26.8 pg (ref 26.0–34.0)
MCV: 79.1 fL (ref 78.0–100.0)
Platelets: 258 10*3/uL (ref 150–400)
RBC: 5.08 MIL/uL (ref 4.22–5.81)
WBC: 8.7 10*3/uL (ref 4.0–10.5)

## 2012-01-11 LAB — BASIC METABOLIC PANEL
BUN: 11 mg/dL (ref 6–23)
CO2: 26 mEq/L (ref 19–32)
Calcium: 8.8 mg/dL (ref 8.4–10.5)
Chloride: 101 mEq/L (ref 96–112)
Creatinine, Ser: 0.96 mg/dL (ref 0.50–1.35)

## 2012-01-11 MED ORDER — HYDROCODONE-ACETAMINOPHEN 7.5-325 MG PO TABS: 1.0000 | ORAL_TABLET | ORAL | Status: DC | PRN

## 2012-01-11 MED ORDER — METHOCARBAMOL 500 MG PO TABS: 500.0000 mg | ORAL_TABLET | Freq: Four times a day (QID) | ORAL | Status: DC | PRN

## 2012-01-11 MED ORDER — ASPIRIN EC 325 MG PO TBEC: 325.0000 mg | DELAYED_RELEASE_TABLET | Freq: Two times a day (BID) | ORAL | Status: DC

## 2012-01-11 MED ORDER — POLYETHYLENE GLYCOL 3350 17 G PO PACK: 17.0000 g | PACK | Freq: Two times a day (BID) | ORAL | Status: DC

## 2012-01-11 MED ORDER — DSS 100 MG PO CAPS: 100.0000 mg | ORAL_CAPSULE | Freq: Two times a day (BID) | ORAL | Status: DC

## 2012-01-11 MED ORDER — CELECOXIB 200 MG PO CAPS: 200.0000 mg | ORAL_CAPSULE | Freq: Two times a day (BID) | ORAL | Status: DC

## 2012-01-11 MED ORDER — NICOTINE 14 MG/24HR TD PT24: 1.0000 | MEDICATED_PATCH | Freq: Every day | TRANSDERMAL | Status: DC

## 2012-01-11 MED ORDER — NICOTINE 14 MG/24HR TD PT24
14.0000 mg | MEDICATED_PATCH | Freq: Every day | TRANSDERMAL | Status: DC
  Administered 2012-01-11: 14 mg via TRANSDERMAL
  Filled 2012-01-11: qty 1

## 2012-01-11 NOTE — Progress Notes (Signed)
Pt for d/c home with HHC PT. IV d/c'd. Dressing CDI to L knee dressing supplies given for home use. D/C instructions & RX given with verbalized understanding. CM arranging for DME's for home, after insurance approval obtained. Will d/c once equipments are available/delivered.

## 2012-01-11 NOTE — Care Management Note (Signed)
    Page 1 of 2   01/11/2012     4:52:37 PM   CARE MANAGEMENT NOTE 01/11/2012  Patient:  REAL, CONA   Account Number:  [id number]  Date Initiated:  01/11/2012  Documentation initiated by:  Colleen Can  Subjective/Objective Assessment:   DX LEFT MEDIAL KNEE COMPARTMENTAL OSTEOARTHRITIS; PARTIAL KNEE REPLACEMNT  Worker's comp [phone]     Action/Plan:   CM spoke with patient. Plans are for him to return to his home in [place] where spoiuse will be caregiver. States he will need RW. Already has crutches and brace.  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  NO (If response is "NO", the following Medicare IM given date fields will be blank) Date Medicare IM given:   Date Additional Medicare IM given:    Discharge Disposition:  HOME W HOME HEALTH SERVICES  Per UR Regulation:  Reviewed for med. necessity/level of care/duration of stay  If discussed at Long Length of Stay Meetings, dates discussed:    Comments:  01/11/2012 1600 Damaris Schooner RN CCM [phone] Received call from [facility] Call Case managemnt; advised thst Genevieve Norlander will provide HHpt services with start date of tomorrow 01/12/2012. CM spoke with [employer] rep-Rhonda who advises that RW will be deloivered in 20 minutes. Pt advised of above. Contact phone number given for [facility] office-[phone].   01/11/2012 Josedaniel Haye BSN RN CCM [phone] RECEIVED CALL FROM ERIN PT REPRESENTATIVE FROM [facility]  OFFICE-PT'S LAWYER'S OFFICE. STATES SHE WILL GET IN CONTACT WITH WORKER'S COMP CONTACT PERSON AND HAVE HER CALL ME REGARDING PT NEEDS FOR HOME HEALTH SERVICES AND DME. Received call from [facility] care managemnt who requested [facility] orders, op note, h&p, face sheet -faxed info [phone]- confirmation received. Received call from [facility] [employer] Case management -who advised that [employer] will deliver RW to pt's room. HHPT set is currently pending-contact info [phone] fax #[phone].

## 2012-01-11 NOTE — Progress Notes (Signed)
Physical Therapy Treatment Patient Details Name: Brent Garrett MRN: [id number] DOB: 08/16/1960 Today's Date: 01/11/2012 Time: 1478-2956 PT Time Calculation (min): 36 min  PT Assessment / Plan / Recommendation Comments on Treatment Session  Pt progressing very well with ambulation.  Practiced stairs and went through exercises.  Ready for D/C.     Follow Up Recommendations  Home health PT;Supervision - Intermittent     Does the patient have the potential to tolerate intense rehabilitation     Barriers to Discharge        Equipment Recommendations  Rolling walker with 5" wheels    Recommendations for Other Services    Frequency 7X/week   Plan Discharge plan remains appropriate    Precautions / Restrictions Precautions Precautions: Knee Restrictions Weight Bearing Restrictions: No Other Position/Activity Restrictions: WBAT   Pertinent Vitals/Pain 2/10    Mobility  Bed Mobility Bed Mobility: Supine to Sit;Sitting - Scoot to Edge of Bed Supine to Sit: 6: Modified independent (Device/Increase time) Sitting - Scoot to Edge of Bed: 6: Modified independent (Device/Increase time) Transfers Transfers: Sit to Stand;Stand to Sit Sit to Stand: 5: Supervision Stand to Sit: 6: Modified independent (Device/Increase time) Details for Transfer Assistance: single cue for hand placement when standing Ambulation/Gait Ambulation/Gait Assistance: 5: Supervision Ambulation Distance (Feet): 300 Feet Assistive device: Rolling walker Ambulation/Gait Assistance Details: min cues for upright posture and step through gait pattern.  Gait Pattern: Step-to pattern;Decreased stride length Gait velocity: decreased Stairs: Yes Stairs Assistance: 4: Min guard Stair Management Technique: No rails;Forwards;Step to pattern;With walker Number of Stairs: 1     Exercises Total Joint Exercises Ankle Circles/Pumps: AROM;Both;20 reps Quad Sets: Strengthening;Left;10 reps Heel Slides: AROM;Left;10  reps Hip ABduction/ADduction: AROM;Left;10 reps Straight Leg Raises: AROM;Left;10 reps Long Arc Quad: AROM;Left;10 reps   PT Diagnosis:    PT Problem List:   PT Treatment Interventions:     PT Goals Acute Rehab PT Goals PT Goal Formulation: With patient Time For Goal Achievement: 01/12/12 Potential to Achieve Goals: Good Pt will Ambulate: >150 feet;with least restrictive assistive device;with supervision PT Goal: Ambulate - Progress: Met Pt will Go Up / Down Stairs: 1-2 stairs;with supervision;with least restrictive assistive device PT Goal: Up/Down Stairs - Progress: Partly met Pt will Perform Home Exercise Program: with supervision, verbal cues required/provided PT Goal: Perform Home Exercise Program - Progress: Met  Visit Information  Last PT Received On: 01/11/12 Assistance Needed: +1    Subjective Data  Subjective: I'm ready to get up Patient Stated Goal: to go home   Cognition  Overall Cognitive Status: Appears within functional limits for tasks assessed/performed Arousal/Alertness: Awake/alert Orientation Level: Appears intact for tasks assessed Behavior During Session: [facility] for tasks performed    Balance     End of Session PT - End of Session Activity Tolerance: Patient tolerated treatment well Patient left: in chair;with call bell/phone within reach;with family/visitor present Nurse Communication: Mobility status   GP     Brent Garrett, Brent Garrett 01/11/2012, 10:27 AM

## 2012-01-11 NOTE — Progress Notes (Signed)
OT Note:  Pt screened for OT.  No acute needs.  Pt has standard commode but feels he can get up from it.  Has tub/shower and will sponge bathe initially.  Marica Otter, OTR/L [phone] 01/11/2012

## 2012-01-11 NOTE — Progress Notes (Signed)
   Subjective: 1 Day Post-Op Procedure(s) (LRB): UNICOMPARTMENTAL KNEE (Left)   Patient reports pain as mild, pain well controlled. No events throughout the night. Ready to be discharged home.   Objective:   VITALS:   Filed Vitals:   01/11/12 0800  BP: 148/83  Pulse: 70  Temp: 97.8 F (36.6 C)   Resp: 16    Neurovascular intact Dorsiflexion/Plantar flexion intact Incision: dressing C/D/I No cellulitis present Compartment soft  LABS  Basename 01/11/12 0415  HGB 13.6  HCT 40.2  WBC 8.7  PLT 258     Basename 01/11/12 0415  NA 134*  K 4.6  BUN 11  CREATININE 0.96  GLUCOSE 142*     Assessment/Plan: 1 Day Post-Op Procedure(s) (LRB): UNICOMPARTMENTAL KNEE (Left) HV drain d/c'ed Foley cath d/c'ed Advance diet Up with therapy D/C IV fluids Discharge home with home health Follow up in 2 weeks at [facility]. Follow-up Information    Follow up with OLIN,Decarlo Rivet D in 2 weeks.   Contact information:   [facility] [address], [address] [place] [address] [phone]         Hyponatremia Treated with IV fluids and will observe      Anastasio Auerbach. Ieshia Hatcher   PAC  01/11/2012, 10:17 AM

## 2012-01-16 NOTE — Discharge Summary (Signed)
Physician Discharge Summary  Patient ID: Brent Garrett MRN: [id number] DOB/AGE: 1960-07-25 51 y.o.  Admit date: 01/10/2012 Discharge date: 01/11/2012   Procedures:  Procedure(s) (LRB): UNICOMPARTMENTAL KNEE (Left)  Attending Physician:  Dr. Durene Romans   Admission Diagnoses:   Left knee medial compartment OA and pain   Discharge Diagnoses:  Principal Problem:  *S/P left unicompartmental knee replacement Active Problems:  Hyponatremia   HPI: Pt is a 51 y.o. male complaining of left knee medial compartment pain since having an incident at work. Originally the pain was thought to be only from an ACL tear, which he did have repaired. His pain continued and he had crunching on the medial aspect of the knee with certain motions. Pain had continually increased since the beginning. X-rays in the clinic show end-stage arthritic changes of the medial compartment of the left knee. Pt has tried various conservative treatments which have failed to alleviate their symptoms, including injections and NSAIDs. Various options are discussed with the patient. Risks, benefits and expectations were discussed with the patient. Patient understand the risks, benefits and expectations and wishes to proceed with surgery. Was originally suppose to have surgery a month ago, but because of the pneumonia episode it was rescheduled. He states nothing has changed with his knee and he is ready for surgery.  PCP: No primary provider on file.   Discharged Condition: good  Hospital Course:  Patient underwent the above stated procedure on 01/10/2012. Patient tolerated the procedure well and brought to the recovery room in good condition and subsequently to the floor.  POD #1 BP: 148/83 ; Pulse: 70 ; Temp: 97.8 F (36.6 C) ; Resp: 16  Pt's foley was removed, as well as the hemovac drain removed. IV was changed to a saline lock. Patient reports pain as mild, pain well controlled. No events throughout the night.  Ready to be discharged home. Neurovascular intact, dorsiflexion/plantar flexion intact, incision: dressing C/D/I, no cellulitis present and compartment soft.   LABS  Basename  01/11/12 0415   HGB  13.6  HCT  40.2    Discharge Exam: General appearance: alert, cooperative and no distress Extremities: Homans sign is negative, no sign of DVT, no edema, redness or tenderness in the calves or thighs and no ulcers, gangrene or trophic changes  Disposition:  Home or Self Care with follow up in 2 weeks   Follow-up Information    Follow up with Shelda Pal, MD. In 2 weeks.   Contact information:   [facility] [address] [place] [address] [phone]          Discharge Orders    Future Orders Please Complete By Expires   Diet - low sodium heart healthy      Call MD / Call 911      Comments:   If you experience chest pain or shortness of breath, CALL 911 and be transported to the hospital emergency room.  If you develope a fever above 101 F, pus (white drainage) or increased drainage or redness at the wound, or calf pain, call your surgeon's office.   Discharge instructions      Comments:   Maintain surgical dressing for 10-14 days, then replace with gauze and tape. Keep the area dry and clean until follow up. Follow up in 2 weeks at [facility]. Call with any questions or concerns.   Constipation Prevention      Comments:   Drink plenty of fluids.  Prune juice may be  helpful.  You may use a stool softener, such as Colace (over the counter) 100 mg twice a day.  Use MiraLax (over the counter) for constipation as needed.   Increase activity slowly as tolerated      Driving restrictions      Comments:   No driving for 4 weeks   TED hose      Comments:   Use stockings (TED hose) for 2 weeks on both leg(s).  You may remove them at night for sleeping.   Change dressing      Comments:   Maintain surgical dressing for 10-14 days, then  change the dressing daily with sterile 4 x 4 inch gauze dressing and tape. Keep the area dry and clean.      Discharge Medication List as of 01/11/2012  1:14 PM    START taking these medications   Details  aspirin EC 325 MG tablet Take 1 tablet (325 mg total) by mouth 2 (two) times daily. X 4 weeks, Starting 01/11/2012, Until Discontinued, No Print    celecoxib (CELEBREX) 200 MG capsule Take 1 capsule (200 mg total) by mouth 2 (two) times daily., Starting 01/11/2012, Until Discontinued, No Print    docusate sodium 100 MG CAPS Take 100 mg by mouth 2 (two) times daily., Starting 01/11/2012, Until Discontinued, No Print    HYDROcodone-acetaminophen (NORCO) 7.5-325 MG per tablet Take 1 tablet by mouth every 4 (four) hours as needed for pain., Starting 01/11/2012, Until Discontinued, Print    methocarbamol (ROBAXIN) 500 MG tablet Take 1 tablet (500 mg total) by mouth every 6 (six) hours as needed., Starting 01/11/2012, Until Discontinued, No Print    nicotine (NICODERM CQ - DOSED IN MG/24 HOURS) 14 mg/24hr patch Place 1 patch onto the skin daily., Starting 01/11/2012, Until Discontinued, Print    polyethylene glycol (MIRALAX / GLYCOLAX) packet Take 17 g by mouth 2 (two) times daily., Starting 01/11/2012, Until Discontinued, No Print      CONTINUE these medications which have NOT CHANGED   Details  Chlorpheniramine-PSE-Ibuprofen (ADVIL ALLERGY SINUS PO) Take by mouth. AS NEEDED, Until Discontinued, Historical Med    Multiple Vitamin (MULTIVITAMIN WITH MINERALS) TABS Take 1 tablet by mouth daily., Until Discontinued, Historical Med         Signed: Anastasio Auerbach. Emett Stapel   PAC  01/16/2012, 2:48 PM

## 2016-05-27 DIAGNOSIS — J0100 Acute maxillary sinusitis, unspecified: Secondary | ICD-10-CM | POA: Diagnosis not present

## 2016-05-27 DIAGNOSIS — J302 Other seasonal allergic rhinitis: Secondary | ICD-10-CM | POA: Diagnosis not present

## 2016-05-27 DIAGNOSIS — H6121 Impacted cerumen, right ear: Secondary | ICD-10-CM | POA: Diagnosis not present

## 2016-05-27 DIAGNOSIS — R05 Cough: Secondary | ICD-10-CM | POA: Diagnosis not present

## 2017-02-17 DIAGNOSIS — J324 Chronic pansinusitis: Secondary | ICD-10-CM | POA: Diagnosis not present

## 2018-02-16 DIAGNOSIS — Z136 Encounter for screening for cardiovascular disorders: Secondary | ICD-10-CM | POA: Diagnosis not present

## 2018-02-16 DIAGNOSIS — Z720 Tobacco use: Secondary | ICD-10-CM | POA: Diagnosis not present

## 2018-02-16 DIAGNOSIS — Z125 Encounter for screening for malignant neoplasm of prostate: Secondary | ICD-10-CM | POA: Diagnosis not present

## 2018-02-16 DIAGNOSIS — N529 Male erectile dysfunction, unspecified: Secondary | ICD-10-CM | POA: Diagnosis not present

## 2018-02-16 DIAGNOSIS — F4320 Adjustment disorder, unspecified: Secondary | ICD-10-CM | POA: Diagnosis not present

## 2018-02-16 DIAGNOSIS — Z13220 Encounter for screening for lipoid disorders: Secondary | ICD-10-CM | POA: Diagnosis not present

## 2018-02-16 DIAGNOSIS — Z1329 Encounter for screening for other suspected endocrine disorder: Secondary | ICD-10-CM | POA: Diagnosis not present

## 2018-02-16 DIAGNOSIS — R030 Elevated blood-pressure reading, without diagnosis of hypertension: Secondary | ICD-10-CM | POA: Diagnosis not present

## 2018-09-17 DIAGNOSIS — Z6822 Body mass index (BMI) 22.0-22.9, adult: Secondary | ICD-10-CM | POA: Diagnosis not present

## 2018-09-17 DIAGNOSIS — N529 Male erectile dysfunction, unspecified: Secondary | ICD-10-CM | POA: Diagnosis not present

## 2018-09-17 DIAGNOSIS — M25551 Pain in right hip: Secondary | ICD-10-CM | POA: Diagnosis not present

## 2019-04-08 DIAGNOSIS — M25511 Pain in right shoulder: Secondary | ICD-10-CM | POA: Diagnosis not present

## 2019-04-08 DIAGNOSIS — M25512 Pain in left shoulder: Secondary | ICD-10-CM | POA: Diagnosis not present

## 2019-04-08 DIAGNOSIS — N529 Male erectile dysfunction, unspecified: Secondary | ICD-10-CM | POA: Diagnosis not present

## 2019-04-08 DIAGNOSIS — M130 Polyarthritis, unspecified: Secondary | ICD-10-CM | POA: Diagnosis not present

## 2019-12-18 DIAGNOSIS — R0982 Postnasal drip: Secondary | ICD-10-CM | POA: Diagnosis not present

## 2019-12-18 DIAGNOSIS — N529 Male erectile dysfunction, unspecified: Secondary | ICD-10-CM | POA: Diagnosis not present

## 2019-12-18 DIAGNOSIS — J309 Allergic rhinitis, unspecified: Secondary | ICD-10-CM | POA: Diagnosis not present

## 2019-12-18 DIAGNOSIS — J019 Acute sinusitis, unspecified: Secondary | ICD-10-CM | POA: Diagnosis not present

## 2020-09-21 DIAGNOSIS — Z20828 Contact with and (suspected) exposure to other viral communicable diseases: Secondary | ICD-10-CM | POA: Diagnosis not present

## 2020-09-21 DIAGNOSIS — J309 Allergic rhinitis, unspecified: Secondary | ICD-10-CM | POA: Diagnosis not present

## 2021-03-11 DIAGNOSIS — M130 Polyarthritis, unspecified: Secondary | ICD-10-CM | POA: Diagnosis not present

## 2021-03-11 DIAGNOSIS — Z125 Encounter for screening for malignant neoplasm of prostate: Secondary | ICD-10-CM | POA: Diagnosis not present

## 2021-03-11 DIAGNOSIS — E7800 Pure hypercholesterolemia, unspecified: Secondary | ICD-10-CM | POA: Diagnosis not present

## 2021-03-11 DIAGNOSIS — Z0000 Encounter for general adult medical examination without abnormal findings: Secondary | ICD-10-CM | POA: Diagnosis not present

## 2021-03-11 DIAGNOSIS — N529 Male erectile dysfunction, unspecified: Secondary | ICD-10-CM | POA: Diagnosis not present

## 2021-03-11 DIAGNOSIS — Z1211 Encounter for screening for malignant neoplasm of colon: Secondary | ICD-10-CM | POA: Diagnosis not present

## 2021-03-11 DIAGNOSIS — Z23 Encounter for immunization: Secondary | ICD-10-CM | POA: Diagnosis not present

## 2021-04-12 DIAGNOSIS — M5441 Lumbago with sciatica, right side: Secondary | ICD-10-CM | POA: Diagnosis not present

## 2021-04-12 DIAGNOSIS — M25561 Pain in right knee: Secondary | ICD-10-CM | POA: Diagnosis not present

## 2021-04-12 DIAGNOSIS — M87051 Idiopathic aseptic necrosis of right femur: Secondary | ICD-10-CM | POA: Diagnosis not present

## 2021-04-12 DIAGNOSIS — M5136 Other intervertebral disc degeneration, lumbar region: Secondary | ICD-10-CM | POA: Diagnosis not present

## 2021-04-12 DIAGNOSIS — M25551 Pain in right hip: Secondary | ICD-10-CM | POA: Diagnosis not present

## 2021-04-21 DIAGNOSIS — M87859 Other osteonecrosis, unspecified femur: Secondary | ICD-10-CM | POA: Diagnosis not present

## 2021-04-21 DIAGNOSIS — M25551 Pain in right hip: Secondary | ICD-10-CM | POA: Diagnosis not present

## 2021-04-28 DIAGNOSIS — M25551 Pain in right hip: Secondary | ICD-10-CM | POA: Diagnosis not present

## 2021-05-17 DIAGNOSIS — M87851 Other osteonecrosis, right femur: Secondary | ICD-10-CM | POA: Diagnosis not present

## 2021-05-17 DIAGNOSIS — M87859 Other osteonecrosis, unspecified femur: Secondary | ICD-10-CM | POA: Diagnosis not present

## 2021-05-24 DIAGNOSIS — M25551 Pain in right hip: Secondary | ICD-10-CM | POA: Diagnosis not present

## 2021-05-31 DIAGNOSIS — M87051 Idiopathic aseptic necrosis of right femur: Secondary | ICD-10-CM | POA: Diagnosis not present

## 2021-05-31 DIAGNOSIS — M25551 Pain in right hip: Secondary | ICD-10-CM | POA: Diagnosis not present

## 2021-06-07 DIAGNOSIS — Z01818 Encounter for other preprocedural examination: Secondary | ICD-10-CM | POA: Diagnosis not present

## 2021-06-07 DIAGNOSIS — M87051 Idiopathic aseptic necrosis of right femur: Secondary | ICD-10-CM | POA: Diagnosis not present

## 2021-06-07 DIAGNOSIS — E7800 Pure hypercholesterolemia, unspecified: Secondary | ICD-10-CM | POA: Diagnosis not present

## 2021-06-07 DIAGNOSIS — N529 Male erectile dysfunction, unspecified: Secondary | ICD-10-CM | POA: Diagnosis not present

## 2021-07-08 NOTE — Progress Notes (Signed)
Sent message, via epic in basket, requesting orders in epic from surgeon.  

## 2021-07-12 ENCOUNTER — Ambulatory Visit: Payer: Self-pay | Admitting: Student

## 2021-07-12 NOTE — H&P (Signed)
TOTAL HIP ADMISSION H&P ? ?Patient is admitted for right total hip arthroplasty. ? ?Subjective: ? ?Chief Complaint: right hip pain ? ?HPI: Brent Garrett, 61 y.o. 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  Onset of symptoms was gradual starting  6 months  ago with rapidlly worsening course since that time.The patient noted no past surgery on the right hip(s).  Patient currently rates pain in the right hip at 10 out of 10 with activity. Patient has night pain, worsening of pain with activity and weight bearing, trendelenberg gait, pain that interfers with activities of daily living, and pain with passive range of motion. 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  by imaging studies. This condition presents safety issues increasing the risk of falls. There is no current active infection. ? ?Patient Active Problem List  ? Diagnosis Date Noted  ? Hyponatremia 01/11/2012  ? S/P left unicompartmental knee replacement 01/10/2012  ? ?Past Medical History:  ?Diagnosis Date  ? Arthritis   ? Pneumonia   ? SEPTEMBER 2013--PT Mier  ?  ?Past Surgical History:  ?Procedure Laterality Date  ? LEFT KNEE ACL REPAIR  2012  ? PARTIAL KNEE ARTHROPLASTY  01/10/2012  ? Procedure: UNICOMPARTMENTAL KNEE;  Surgeon: Mauri Pole, MD;  Location: [facility];  Service: Orthopedics;  Laterality: Left;  Left Knee Medial Unicompartmental  ?  ?Current Outpatient Medications  ?Medication Sig Dispense Refill Last Dose  ? aspirin EC 325 MG tablet Take 1 tablet (325 mg total) by mouth 2 (two) times daily. X 4 weeks 60 tablet 0   ?  celecoxib (CELEBREX) 200 MG capsule Take 1 capsule (200 mg total) by mouth 2 (two) times daily. 60 capsule 0   ? Chlorpheniramine-PSE-Ibuprofen (ADVIL ALLERGY SINUS PO) Take by mouth. AS NEEDED     ? docusate sodium 100 MG CAPS Take 100 mg by mouth 2 (two) times daily. 10 capsule    ? HYDROcodone-acetaminophen (NORCO) 7.5-325 MG per tablet Take 1 tablet by mouth every 4 (four) hours as needed for pain. 120 tablet 0   ? methocarbamol (ROBAXIN) 500 MG tablet Take 1 tablet (500 mg total) by mouth every 6 (six) hours as needed.     ? Multiple Vitamin (MULTIVITAMIN WITH MINERALS) TABS Take 1 tablet by mouth daily.     ? nicotine (NICODERM CQ - DOSED IN MG/24 HOURS) 14 mg/24hr patch Place 1 patch onto the skin daily. 28 patch 0   ? polyethylene glycol (MIRALAX / GLYCOLAX) packet Take 17 g by mouth 2 (two) times daily. 14 each    ? ?No current facility-administered medications for this visit.  ? ?No Known Allergies  ?Social History  ? ?Tobacco Use  ? Smoking status: Every Day  ?  Packs/day: 1.00  ?  Years: 30.00  ?  Pack years: 30.00  ?  Types: Cigarettes  ? Smokeless tobacco: Never  ?Substance Use Topics  ? Alcohol use: Yes  ?  Comment: WEEK END  ?  ?No family history on file.  ? ?Review of Systems  ?Musculoskeletal:  Positive for arthralgias, back pain, gait problem and myalgias.  ?All other systems reviewed and are negative. ? ?  Objective: ? ?Physical Exam ?Constitutional:   ?   Appearance: Normal appearance. He is normal weight.  ?HENT:  ?   Head: Normocephalic and atraumatic.  ?   Nose: Nose normal.  ?   Mouth/Throat:  ?   Mouth: Mucous membranes are moist.  ?   Pharynx: Oropharynx is clear.  ?Eyes:  ?   Extraocular Movements: Extraocular movements intact.  ?Cardiovascular:  ?   Rate and Rhythm: Normal rate and regular rhythm.  ?   Pulses: Normal pulses.  ?   Heart sounds: Normal heart sounds.  ?Pulmonary:  ?   Effort: Pulmonary effort is normal.  ?   Breath sounds: Normal breath sounds.  ?Abdominal:  ?   General:  Abdomen is flat.  ?   Palpations: Abdomen is soft.  ?Genitourinary: ?   Comments: deferred ?Musculoskeletal:  ?   Cervical back: Normal range of motion and neck supple.  ?   Right hip: Tenderness present. Decreased range of motion.  ?Skin: ?   General: Skin is warm and dry.  ?   Capillary Refill: Capillary refill takes less than 2 seconds.  ?Neurological:  ?   General: No focal deficit present.  ?   Mental Status: He is alert and oriented to person, place, and time.  ?Psychiatric:     ?   Mood and Affect: Mood normal.     ?   Behavior: Behavior normal.     ?   Thought Content: Thought content normal.     ?   Judgment: Judgment normal.  ? ? ?Vital signs in last 24 hours: ?@VSRANGES @ ? ?Labs: ? ? ?Estimated body mass index is 24.41 kg/m? as calculated from the following: ?  Height as of 01/11/12: 5\' 10"  (1.778 m). ?  Weight as of 01/11/12: 77.2 kg. ? ? ?Imaging Review ?Plain radiographs demonstrate severe degenerative joint disease of the right hip(s). The bone quality appears to be adequate for age and reported activity level. MRI images and report of the right hip dated 05/24/2021: AVN of the right femoral head with subchondral collapse. Severe right hip arthritis. Moderate effusion. Mild synovitis. ? ? ? ? ? ? ?Assessment/Plan: ? 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 The treatment options including medical management, injection therapy, arthroscopy and arthroplasty were discussed at length. The risks and benefits of total hip arthroplasty were presented and reviewed. The risks due to aseptic loosening, infection, stiffness, dislocation/subluxation,  thromboembolic  complications and other imponderables were discussed.  The patient acknowledged the explanation, agreed to proceed with the plan and consent was signed. Patient is being admitted for inpatient treatment for surgery, pain control, PT, OT, prophylactic antibiotics, VTE prophylaxis, progressive ambulation and ADL's and discharge planning.The patient is planning to be discharged home with HEP.  ? ?Therapy Plans: HEP. Discussed the importance of hip abductor strengthening with 150-300 standing side leg raises per day. Also riding a stationary bike/elliptical for 30 minutes 3x/week.  ?Disposition: Home with wife ?Planned DVT Prophylaxis: aspirin 81mg  BID ?DME needed: rolling walker ?PCP: Cleared  ?TXA: Yes ?Allergies: NKDA ?Anesthesia Concerns: None ?BMI: 23.8 ?Last HgbA1c: 5.9 ?Other: ?- Diclofenac, hydrocodone, ?  ?Instructed patient on which medications to discontinue 5 days prior to surgery.  Including vitamins, supplements, diclofenac, and other NSAIDs.  ? ? ? ?Patient's anticipated LOS is less than 2 midnights, meeting these requirements: ?- Younger than 15 ?- Lives within 1 hour of care ?- Has a competent adult at home to recover with post-op recover ?- NO history of ? - Chronic pain requiring opiods ? - Diabetes ? - Coronary Artery Disease ? - Heart failure ? - Heart attack ? - Stroke ? - DVT/VTE ? - Cardiac arrhythmia ? - Respiratory Failure/COPD ? - Renal failure ? - Anemia ? - Advanced Liver disease ? ? ?

## 2021-07-12 NOTE — H&P (View-Only) (Signed)
TOTAL HIP ADMISSION H&P ? ?Patient is admitted for right total hip arthroplasty. ? ?Subjective: ? ?Chief Complaint: right hip pain ? ?HPI: Brent Garrett, 61 y.o. 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  Onset of symptoms was gradual starting  6 months  ago with rapidlly worsening course since that time.The patient noted no past surgery on the right hip(s).  Patient currently rates pain in the right hip at 10 out of 10 with activity. Patient has night pain, worsening of pain with activity and weight bearing, trendelenberg gait, pain that interfers with activities of daily living, and pain with passive range of motion. 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  by imaging studies. This condition presents safety issues increasing the risk of falls. There is no current active infection. ? ?Patient Active Problem List  ? Diagnosis Date Noted  ? Hyponatremia 01/11/2012  ? S/P left unicompartmental knee replacement 01/10/2012  ? ?Past Medical History:  ?Diagnosis Date  ? Arthritis   ? Pneumonia   ? SEPTEMBER 2013--PT FEELING FINE NOW  ?  ?Past Surgical History:  ?Procedure Laterality Date  ? LEFT KNEE ACL REPAIR  2012  ? PARTIAL KNEE ARTHROPLASTY  01/10/2012  ? Procedure: UNICOMPARTMENTAL KNEE;  Surgeon: Matthew D Olin, MD;  Location: [facility];  Service: Orthopedics;  Laterality: Left;  Left Knee Medial Unicompartmental  ?  ?Current Outpatient Medications  ?Medication Sig Dispense Refill Last Dose  ? aspirin EC 325 MG tablet Take 1 tablet (325 mg total) by mouth 2 (two) times daily. X 4 weeks 60 tablet 0   ?  celecoxib (CELEBREX) 200 MG capsule Take 1 capsule (200 mg total) by mouth 2 (two) times daily. 60 capsule 0   ? Chlorpheniramine-PSE-Ibuprofen (ADVIL ALLERGY SINUS PO) Take by mouth. AS NEEDED     ? docusate sodium 100 MG CAPS Take 100 mg by mouth 2 (two) times daily. 10 capsule    ? HYDROcodone-acetaminophen (NORCO) 7.5-325 MG per tablet Take 1 tablet by mouth every 4 (four) hours as needed for pain. 120 tablet 0   ? methocarbamol (ROBAXIN) 500 MG tablet Take 1 tablet (500 mg total) by mouth every 6 (six) hours as needed.     ? Multiple Vitamin (MULTIVITAMIN WITH MINERALS) TABS Take 1 tablet by mouth daily.     ? nicotine (NICODERM CQ - DOSED IN MG/24 HOURS) 14 mg/24hr patch Place 1 patch onto the skin daily. 28 patch 0   ? polyethylene glycol (MIRALAX / GLYCOLAX) packet Take 17 g by mouth 2 (two) times daily. 14 each    ? ?No current facility-administered medications for this visit.  ? ?No Known Allergies  ?Social History  ? ?Tobacco Use  ? Smoking status: Every Day  ?  Packs/day: 1.00  ?  Years: 30.00  ?  Pack years: 30.00  ?  Types: Cigarettes  ? Smokeless tobacco: Never  ?Substance Use Topics  ? Alcohol use: Yes  ?  Comment: WEEK END  ?  ?No family history on file.  ? ?Review of Systems  ?Musculoskeletal:  Positive for arthralgias, back pain, gait problem and myalgias.  ?All other systems reviewed and are negative. ? ?  Objective: ? ?Physical Exam ?Constitutional:   ?   Appearance: Normal appearance. He is normal weight.  ?HENT:  ?   Head: Normocephalic and atraumatic.  ?   Nose: Nose normal.  ?   Mouth/Throat:  ?   Mouth: Mucous membranes are moist.  ?   Pharynx: Oropharynx is clear.  ?Eyes:  ?   Extraocular Movements: Extraocular movements intact.  ?Cardiovascular:  ?   Rate and Rhythm: Normal rate and regular rhythm.  ?   Pulses: Normal pulses.  ?   Heart sounds: Normal heart sounds.  ?Pulmonary:  ?   Effort: Pulmonary effort is normal.  ?   Breath sounds: Normal breath sounds.  ?Abdominal:  ?   General:  Abdomen is flat.  ?   Palpations: Abdomen is soft.  ?Genitourinary: ?   Comments: deferred ?Musculoskeletal:  ?   Cervical back: Normal range of motion and neck supple.  ?   Right hip: Tenderness present. Decreased range of motion.  ?Skin: ?   General: Skin is warm and dry.  ?   Capillary Refill: Capillary refill takes less than 2 seconds.  ?Neurological:  ?   General: No focal deficit present.  ?   Mental Status: He is alert and oriented to person, place, and time.  ?Psychiatric:     ?   Mood and Affect: Mood normal.     ?   Behavior: Behavior normal.     ?   Thought Content: Thought content normal.     ?   Judgment: Judgment normal.  ? ? ?Vital signs in last 24 hours: ?@VSRANGES@ ? ?Labs: ? ? ?Estimated body mass index is 24.41 kg/m? as calculated from the following: ?  Height as of 01/11/12: 5' 10" (1.778 m). ?  Weight as of 01/11/12: 77.2 kg. ? ? ?Imaging Review ?Plain radiographs demonstrate severe degenerative joint disease of the right hip(s). The bone quality appears to be adequate for age and reported activity level. MRI images and report of the right hip dated 05/24/2021: AVN of the right femoral head with subchondral collapse. Severe right hip arthritis. Moderate effusion. Mild synovitis. ? ? ? ? ? ? ?Assessment/Plan: ? 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 The treatment options including medical management, injection therapy, arthroscopy and arthroplasty were discussed at length. The risks and benefits of total hip arthroplasty were presented and reviewed. The risks due to aseptic loosening, infection, stiffness, dislocation/subluxation,  thromboembolic  complications and other imponderables were discussed.  The patient acknowledged the explanation, agreed to proceed with the plan and consent was signed. Patient is being admitted for inpatient treatment for surgery, pain control, PT, OT, prophylactic antibiotics, VTE prophylaxis, progressive ambulation and ADL's and discharge planning.The patient is planning to be discharged home with HEP.  ? ?Therapy Plans: HEP. Discussed the importance of hip abductor strengthening with 150-300 standing side leg raises per day. Also riding a stationary bike/elliptical for 30 minutes 3x/week.  ?Disposition: Home with wife ?Planned DVT Prophylaxis: aspirin 81mg BID ?DME needed: rolling walker ?PCP: Cleared  ?TXA: Yes ?Allergies: NKDA ?Anesthesia Concerns: None ?BMI: 23.8 ?Last HgbA1c: 5.9 ?Other: ?- Diclofenac, hydrocodone, ?  ?Instructed patient on which medications to discontinue 5 days prior to surgery.   Including vitamins, supplements, diclofenac, and other NSAIDs.  ? ? ? ?Patient's anticipated LOS is less than 2 midnights, meeting these requirements: ?- Younger than 65 ?- Lives within 1 hour of care ?- Has a competent adult at home to recover with post-op recover ?- NO history of ? - Chronic pain requiring opiods ? - Diabetes ? - Coronary Artery Disease ? - Heart failure ? - Heart attack ? - Stroke ? - DVT/VTE ? - Cardiac arrhythmia ? - Respiratory Failure/COPD ? - Renal failure ? - Anemia ? - Advanced Liver disease ? ? ?

## 2021-07-13 NOTE — Patient Instructions (Addendum)
DUE TO COVID-19 ONLY TWO VISITORS  (aged 62 and older)  ARE ALLOWED TO COME WITH YOU AND STAY IN THE WAITING ROOM ONLY DURING PRE OP AND PROCEDURE.   ?**NO VISITORS ARE ALLOWED IN THE SHORT STAY AREA OR RECOVERY ROOM!!** ? ? Your procedure is scheduled on: 07/28/21 ? ? [facility] ? ?  Report to admitting at 6:00 AM ? ? Call this number if you have problems the morning of surgery [phone] ? ? Do not eat food :After Midnight. ? ? After Midnight you may have the following liquids until 5:30 AM DAY OF SURGERY ? ?Water ?Black Coffee (sugar ok, NO MILK/CREAM OR CREAMERS)  ?Tea (sugar ok, NO MILK/CREAM OR CREAMERS) regular and decaf                             ?Plain Jell-O (NO RED)                                           ?Fruit ices (not with fruit pulp, NO RED)                                     ?Popsicles (NO RED)                                                                  ?Juice: apple, WHITE grape, WHITE cranberry ?Sports drinks like Gatorade (NO RED) ?Clear broth(vegetable,chicken,beef) ? ?              ?The day of surgery:  ?Drink ONE (1) Pre-Surgery Clear Ensure at 5:30 AM the morning of surgery. Drink in one sitting. Do not sip.  ?This drink was given to you during your hospital  ?pre-op appointment visit. ?Nothing else to drink after completing the  ?Pre-Surgery Clear Ensure. ?  ?       If you have questions, please contact your surgeon?s office. ? ? ?FOLLOW BOWEL PREP AND ANY ADDITIONAL PRE OP INSTRUCTIONS YOU RECEIVED FROM YOUR SURGEON'S OFFICE!!! ?  ?  ?Oral Hygiene is also important to reduce your risk of infection.                                    ?Remember - BRUSH YOUR TEETH THE MORNING OF SURGERY WITH YOUR REGULAR TOOTHPASTE ? ? Do NOT smoke after Midnight ? ? Take these medicines the morning of surgery with A SIP OF WATER:  ?                  ?           You may not have any metal on your body including jewelry, and body piercing ? ?           Do not wear  lotions, powders, cologne, or deodorant ? ?            Men may shave face and neck. ? ? Do  not bring valuables to the hospital. [facility] IS NOT ?            RESPONSIBLE   FOR VALUABLES. ?  ? Patients discharged on the day of surgery will not be allowed to drive home.  Someone NEEDS to stay with you for the first 24 hours after anesthesia. ? ? Special Instructions: Bring a copy of your healthcare power of attorney and living will documents         the day of surgery if you haven't scanned them before. ? ?            Please read over the following fact sheets you were given: IF YOU HAVE QUESTIONS ABOUT YOUR PRE-OP INSTRUCTIONS PLEASE CALL [phone]- Fleet Contras ? ?   [facility] - Preparing for Surgery ?Before surgery, you can play an important role.  Because skin is not sterile, your skin needs to be as free of germs as possible.  You can reduce the number of germs on your skin by washing with CHG (chlorahexidine gluconate) soap before surgery.  CHG is an antiseptic cleaner which kills germs and bonds with the skin to continue killing germs even after washing. ?Please DO NOT use if you have an allergy to CHG or antibacterial soaps.  If your skin becomes reddened/irritated stop using the CHG and inform your nurse when you arrive at Short Stay. ?Do not shave (including legs and underarms) for at least 48 hours prior to the first CHG shower.  You may shave your face/neck. ? ?Please follow these instructions carefully: ? 1.  Shower with CHG Soap the night before surgery and the  morning of surgery. ? 2.  If you choose to wash your hair, wash your hair first as usual with your normal  shampoo. ? 3.  After you shampoo, rinse your hair and body thoroughly to remove the shampoo.                            ? 4.  Use CHG as you would any other liquid soap.  You can apply chg directly to the skin and wash.  Gently with a scrungie or clean washcloth. ? 5.  Apply the CHG Soap to your body ONLY FROM THE NECK DOWN.   Do   not use  on face/ open      ?                     Wound or open sores. Avoid contact with eyes, ears mouth and   genitals (private parts).  ?                     Engineering geologist,  Genitals (private parts) with your normal soap. ?            6.  Wash thoroughly, paying special attention to the area where your    surgery  will be performed. ? 7.  Thoroughly rinse your body with warm water from the neck down. ? 8.  DO NOT shower/wash with your normal soap after using and rinsing off the CHG Soap. ?               9.  Pat yourself dry with a clean towel. ?           10.  Wear clean pajamas. ?           11.  Place clean sheets  on your bed the night of your first shower and do not  sleep with pets. ?Day of Surgery : ?Do not apply any lotions/deodorants the morning of surgery.  Please wear clean clothes to the hospital/surgery center. ? ?FAILURE TO FOLLOW THESE INSTRUCTIONS MAY RESULT IN THE CANCELLATION OF YOUR SURGERY ? ?PATIENT SIGNATURE_________________________________ ? ?NURSE SIGNATURE__________________________________ ? ?________________________________________________________________________  ? ?Incentive Spirometer ? ?An incentive spirometer is a tool that can help keep your lungs clear and active. This tool measures how well you are filling your lungs with each breath. Taking long deep breaths may help reverse or decrease the chance of developing breathing (pulmonary) problems (especially infection) following: ?A long period of time when you are unable to move or be active. ?BEFORE THE PROCEDURE  ?If the spirometer includes an indicator to show your best effort, your nurse or respiratory therapist will set it to a desired goal. ?If possible, sit up straight or lean slightly forward. Try not to slouch. ?Hold the incentive spirometer in an upright position. ?INSTRUCTIONS FOR USE  ?Sit on the edge of your bed if possible, or sit up as far as you can in bed or on a chair. ?Hold the incentive spirometer in an upright  position. ?Breathe out normally. ?Place the mouthpiece in your mouth and seal your lips tightly around it. ?Breathe in slowly and as deeply as possible, raising the piston or the ball toward the top of the column. ?Hold your breath for 3-5 seconds or for as long as possible. Allow the piston or ball to fall to the bottom of the column. ?Remove the mouthpiece from your mouth and breathe out normally. ?Rest for a few seconds and repeat Steps 1 through 7 at least 10 times every 1-2 hours when you are awake. Take your time and take a few normal breaths between deep breaths. ?The spirometer may include an indicator to show your best effort. Use the indicator as a goal to work toward during each repetition. ?After each set of 10 deep breaths, practice coughing to be sure your lungs are clear. If you have an incision (the cut made at the time of surgery), support your incision when coughing by placing a pillow or rolled up towels firmly against it. ?Once you are able to get out of bed, walk around indoors and cough well. You may stop using the incentive spirometer when instructed by your caregiver.  ?RISKS AND COMPLICATIONS ?Take your time so you do not get dizzy or light-headed. ?If you are in pain, you may need to take or ask for pain medication before doing incentive spirometry. It is harder to take a deep breath if you are having pain. ?AFTER USE ?Rest and breathe slowly and easily. ?It can be helpful to keep track of a log of your progress. Your caregiver can provide you with a simple table to help with this. ?If you are using the spirometer at home, follow these instructions: ?SEEK MEDICAL CARE IF:  ?You are having difficultly using the spirometer. ?You have trouble using the spirometer as often as instructed. ?Your pain medication is not giving enough relief while using the spirometer. ?You develop fever of 100.5? F (38.1? C) or higher. ?SEEK IMMEDIATE MEDICAL CARE IF:  ?You cough up bloody sputum that had not been  present before. ?You develop fever of 102? F (38.9? C) or greater. ?You develop worsening pain at or near the incision site. ?MAKE SURE YOU:  ?Understand these instructions. ?Will watch your condition. ?Will get help ri

## 2021-07-15 ENCOUNTER — Encounter (HOSPITAL_COMMUNITY)
Admission: RE | Admit: 2021-07-15 | Discharge: 2021-07-15 | Disposition: A | Discharge status: Home / Self Care | Payer: BC Managed Care – PPO | Source: Ambulatory Visit | Attending: Orthopedic Surgery | Admitting: Orthopedic Surgery

## 2021-07-15 ENCOUNTER — Encounter (HOSPITAL_COMMUNITY): Payer: Self-pay

## 2021-07-15 VITALS — BP 138/97 | HR 95 | Temp 98.0°F | Resp 16 | Ht 70.0 in

## 2021-07-15 DIAGNOSIS — Z01818 Encounter for other preprocedural examination: Secondary | ICD-10-CM | POA: Insufficient documentation

## 2021-07-15 DIAGNOSIS — M87851 Other osteonecrosis, right femur: Secondary | ICD-10-CM | POA: Insufficient documentation

## 2021-07-15 DIAGNOSIS — R030 Elevated blood-pressure reading, without diagnosis of hypertension: Secondary | ICD-10-CM | POA: Insufficient documentation

## 2021-07-15 DIAGNOSIS — Z87891 Personal history of nicotine dependence: Secondary | ICD-10-CM | POA: Insufficient documentation

## 2021-07-15 HISTORY — DX: Hyperlipidemia, unspecified: E78.5

## 2021-07-15 LAB — SURGICAL PCR SCREEN
MRSA, PCR: NEGATIVE
Staphylococcus aureus: NEGATIVE

## 2021-07-15 LAB — BASIC METABOLIC PANEL
Anion gap: 9 (ref 5–15)
BUN: 14 mg/dL (ref 8–23)
CO2: 26 mmol/L (ref 22–32)
Calcium: 9.5 mg/dL (ref 8.9–10.3)
Chloride: 104 mmol/L (ref 98–111)
Creatinine, Ser: 1 mg/dL (ref 0.61–1.24)
GFR, Estimated: >60 mL/min (ref >60)
Glucose, Bld: 98 mg/dL (ref 70–99)
Potassium: 4.6 mmol/L (ref 3.5–5.1)
Sodium: 139 mmol/L (ref 135–145)

## 2021-07-15 LAB — TYPE AND SCREEN
ABO/RH(D): O POS
Antibody Screen: NEGATIVE

## 2021-07-15 LAB — CBC
HCT: 47.1 % (ref 39.0–52.0)
Hemoglobin: 15.5 g/dL (ref 13.0–17.0)
MCH: 27.3 pg (ref 26.0–34.0)
MCHC: 32.9 g/dL (ref 30.0–36.0)
MCV: 83.1 fL (ref 80.0–100.0)
Platelets: 264 10*3/uL (ref 150–400)
RBC: 5.67 MIL/uL (ref 4.22–5.81)
RDW: 16.7 % — ABNORMAL HIGH (ref 11.5–15.5)
WBC: 6.4 10*3/uL (ref 4.0–10.5)
nRBC: 0 % (ref 0.0–0.2)

## 2021-07-15 NOTE — Progress Notes (Signed)
COVID Vaccine Completed: yes x2 ?Date COVID Vaccine completed: ?Has received booster: ?COVID vaccine manufacturer: [employer]  ? ?Date of COVID positive in last 90 days: n/a ? ?PCP - Carlyle Lipa, MD ?Cardiologist - n/a ? ?Chest x-ray - n/a ?EKG - 07/15/21 Epic/ chart ?Stress Test - n/a ?ECHO - n/a ?Cardiac Cath - n/a ?Pacemaker/ICD device last checked: n/a ?Spinal Cord Stimulator: n/a ? ?Bowel Prep - no ? ?Sleep Study - n/a ?CPAP -  ? ?Fasting Blood Sugar - n/a ?Checks Blood Sugar _____ times a day ? ?Blood Thinner Instructions:  ?Aspirin Instructions: ASA 325, hold 7 days ?Last Dose: ? ?Activity level: Can go up a flight of stairs and perform activities of daily living without stopping and without symptoms of chest pain or shortness of breath. ?     ? ?Anesthesia review: BP 140/102, recheck 138/97. Patient denies history of HTN. Per Janett Billow, [place] will obtain EKG and instructed patient to call PCP and let them know Bps today. Patient denied any symptoms at PAT appointment ? ?Patient denies shortness of breath, fever, cough and chest pain at PAT appointment ? ? ?Patient verbalized understanding of instructions that were given to them at the PAT appointment. Patient was also instructed that they will need to review over the PAT instructions again at home before surgery.  ?

## 2021-07-16 ENCOUNTER — Encounter (HOSPITAL_COMMUNITY): Payer: Self-pay

## 2021-07-16 NOTE — Anesthesia Preprocedure Evaluation (Addendum)
Anesthesia Evaluation  ?Patient identified by MRN, date of birth, ID band ?Patient awake ? ? ? ?Reviewed: ?Allergy & Precautions, NPO status , Patient's Chart, lab work & pertinent test results ? ?Airway ?Mallampati: I ? ?TM Distance: >3 FB ?Neck ROM: Full ? ? ? Dental ?no notable dental hx. ?(+) Teeth Intact, Dental Advisory Given ?  ?Pulmonary ?Current Smoker and Patient abstained from smoking.,  ?  ?Pulmonary exam normal ?breath sounds clear to auscultation ? ? ? ? ? ? Cardiovascular ?Exercise Tolerance: Good ?Normal cardiovascular exam ?Rhythm:Regular Rate:Normal ? ?5-4 EKG SR R 83 ?  ?Neuro/Psych ?negative neurological ROS ? negative psych ROS  ? GI/Hepatic ?negative GI ROS, Neg liver ROS,   ?Endo/Other  ?negative endocrine ROS ? Renal/GU ?Lab Results ?     Component                Value               Date                 ?     CREATININE               1.00                07/15/2021           ?     K                        4.6                 07/15/2021           ?      ? ?  ?Musculoskeletal ? ?(+) Arthritis ,  ? Abdominal ?  ?Peds ? Hematology ?Lab Results ?     Component                Value               Date                    ?     HGB                      15.5                07/15/2021           ?     HCT                      47.1                07/15/2021           ?        PLT                      264                 07/15/2021           ?   ?Anesthesia Other Findings ? ? Reproductive/Obstetrics ? ?  ? ? ? ? ? ? ? ? ? ? ? ? ? ?  ?  ? ? ? ? ? ? ?Anesthesia Physical ?Anesthesia Plan ? ?ASA: 2 ? ?Anesthesia Plan: Spinal  ? ?Post-op Pain Management: Regional block* and Minimal or no pain anticipated  ? ?Induction:  ? ?PONV Risk Score  and Plan: 1 and Treatment may vary due to age or medical condition, Midazolam, Ondansetron and Dexamethasone ? ?Airway Management Planned: Natural Airway and Nasal Cannula ? ?Additional Equipment: None ? ?Intra-op Plan:  ? ?Post-operative Plan:   ? ?Informed Consent: I have reviewed the patients History and Physical, chart, labs and discussed the procedure including the risks, benefits and alternatives for the proposed anesthesia with the patient or authorized representative who has indicated his/her understanding and acceptance.  ? ? ? ?Dental advisory given ? ?Plan Discussed with:  ? ?Anesthesia Plan Comments: (See APP note by Joslyn Hy, FNP )  ? ? ? ? ?Anesthesia Quick Evaluation ? ?

## 2021-07-16 NOTE — Progress Notes (Signed)
Anesthesia Chart Review: ? ? Case: [id number] Date/Time: 07/28/21 0815  ? Procedure: TOTAL HIP ARTHROPLASTY ANTERIOR APPROACH (Right: Hip)  ? Anesthesia type: Spinal  ? Pre-op diagnosis: Right hip avascular necrosis  ? Location: [facility] ROOM 08 / [facility]  ? Surgeons: Samson Frederic, MD  ? ?  ? ? ?DISCUSSION: ?Pt is 61 years old with hx smoking, elevated BP but no dx of HTN.  ? ?BP at pre-surgical testing was 140/102, then 138/97 on recheck. Pt instructed to notify PCP of elevated BP and EKG obtained. BP at PCP's office 06/07/21 was 124/74 ? ?VS: BP (!) 138/97   Pulse 95   Temp 36.7 ?C (Oral)   Resp 16   Ht 5\' 10"  (1.778 m)   SpO2 98%   BMI 24.41 kg/m?  ? ?PROVIDERS: ?- PCP is , NP who cleared pt for surgery at low risk 06/07/21 ? ? ?LABS: Labs reviewed: Acceptable for surgery. ?(all labs ordered are listed, but only abnormal results are displayed) ? ?Labs Reviewed  ?CBC - Abnormal; Notable for the following components:  ?    Result Value  ? RDW 16.7 (*)   ? All other components within normal limits  ?SURGICAL PCR SCREEN  ?BASIC METABOLIC PANEL  ?TYPE AND SCREEN  ? ? ?EKG 07/15/21: Sinus rhythm with short PR ? ? ?CV: N/A ? ?Past Medical History:  ?Diagnosis Date  ? Arthritis   ? Hyperlipemia   ? Pneumonia   ? SEPTEMBER 2013--PT FEELING FINE NOW  ? ? ?Past Surgical History:  ?Procedure Laterality Date  ? LEFT KNEE ACL REPAIR  2012  ? PARTIAL KNEE ARTHROPLASTY  01/10/2012  ? Procedure: UNICOMPARTMENTAL KNEE;  Surgeon: 01/12/2012, MD;  Location: [facility];  Service: Orthopedics;  Laterality: Left;  Left Knee Medial Unicompartmental  ? ? ?MEDICATIONS: ? acetaminophen (TYLENOL) 650 MG CR tablet  ? Multiple Vitamins-Minerals (ADULT GUMMY PO)  ? rosuvastatin (CRESTOR) 10 MG tablet  ? ?No current facility-administered medications for this encounter.  ? ? ?If BP acceptable day of surgery, I anticipate pt can proceed with surgery as scheduled. ? ?Shelda Pal, PhD, FNP-BC ?[facility] Stay [facility]  [facility]/Anesthesiology ?Phone: [phone] ?07/16/2021 2:17 PM  ? ? ? ? ? ? ?

## 2021-07-28 ENCOUNTER — Encounter (HOSPITAL_COMMUNITY)
Admission: RE | Disposition: A | Discharge status: Home / Self Care | Payer: Self-pay | Source: Ambulatory Visit | Attending: Orthopedic Surgery

## 2021-07-28 ENCOUNTER — Ambulatory Visit (HOSPITAL_COMMUNITY): Payer: BC Managed Care – PPO

## 2021-07-28 ENCOUNTER — Other Ambulatory Visit: Payer: Self-pay

## 2021-07-28 ENCOUNTER — Ambulatory Visit (HOSPITAL_COMMUNITY)
Admission: RE | Admit: 2021-07-28 | Discharge: 2021-07-28 | Disposition: A | Discharge status: Home / Self Care | Payer: BC Managed Care – PPO | Source: Ambulatory Visit | Attending: Orthopedic Surgery | Admitting: Orthopedic Surgery

## 2021-07-28 ENCOUNTER — Ambulatory Visit (HOSPITAL_COMMUNITY): Payer: BC Managed Care – PPO | Admitting: Physician Assistant

## 2021-07-28 ENCOUNTER — Ambulatory Visit (HOSPITAL_COMMUNITY): Payer: BC Managed Care – PPO | Admitting: Anesthesiology

## 2021-07-28 ENCOUNTER — Encounter (HOSPITAL_COMMUNITY): Payer: Self-pay | Admitting: Orthopedic Surgery

## 2021-07-28 DIAGNOSIS — M87051 Idiopathic aseptic necrosis of right femur: Secondary | ICD-10-CM | POA: Diagnosis present

## 2021-07-28 DIAGNOSIS — Z96641 Presence of right artificial hip joint: Secondary | ICD-10-CM

## 2021-07-28 DIAGNOSIS — R262 Difficulty in walking, not elsewhere classified: Secondary | ICD-10-CM | POA: Insufficient documentation

## 2021-07-28 DIAGNOSIS — M879 Osteonecrosis, unspecified: Secondary | ICD-10-CM | POA: Insufficient documentation

## 2021-07-28 DIAGNOSIS — M87851 Other osteonecrosis, right femur: Secondary | ICD-10-CM | POA: Diagnosis not present

## 2021-07-28 DIAGNOSIS — M1612 Unilateral primary osteoarthritis, left hip: Secondary | ICD-10-CM | POA: Diagnosis not present

## 2021-07-28 DIAGNOSIS — Z01818 Encounter for other preprocedural examination: Secondary | ICD-10-CM

## 2021-07-28 DIAGNOSIS — Z471 Aftercare following joint replacement surgery: Secondary | ICD-10-CM | POA: Diagnosis not present

## 2021-07-28 DIAGNOSIS — F17210 Nicotine dependence, cigarettes, uncomplicated: Secondary | ICD-10-CM | POA: Insufficient documentation

## 2021-07-28 DIAGNOSIS — Z96642 Presence of left artificial hip joint: Secondary | ICD-10-CM | POA: Diagnosis not present

## 2021-07-28 HISTORY — PX: TOTAL HIP ARTHROPLASTY: SHX124

## 2021-07-28 SURGERY — ARTHROPLASTY, HIP, TOTAL, ANTERIOR APPROACH
Anesthesia: Spinal | Site: Hip | Laterality: Right

## 2021-07-28 MED ORDER — ACETAMINOPHEN 500 MG PO TABS
1000.0000 mg | ORAL_TABLET | Freq: Once | ORAL | Status: AC
Start: 2021-07-28 — End: 2021-07-28
  Administered 2021-07-28: 1000 mg via ORAL
  Filled 2021-07-28: qty 2

## 2021-07-28 MED ORDER — LACTATED RINGERS IV BOLUS
500.0000 mL | Freq: Once | INTRAVENOUS | Status: AC
  Administered 2021-07-28: 500 mL via INTRAVENOUS

## 2021-07-28 MED ORDER — ACETAMINOPHEN 10 MG/ML IV SOLN: 1000.0000 mg | Freq: Once | INTRAVENOUS | Status: DC | PRN

## 2021-07-28 MED ORDER — SENNA 8.6 MG PO TABS: 2.0000 | ORAL_TABLET | Freq: Every day | ORAL | 0 refills | Status: AC

## 2021-07-28 MED ORDER — HYDROMORPHONE HCL 1 MG/ML IJ SOLN: 0.2500 mg | INTRAMUSCULAR | Status: DC | PRN

## 2021-07-28 MED ORDER — METHOCARBAMOL 500 MG PO TABS: 500.0000 mg | ORAL_TABLET | Freq: Four times a day (QID) | ORAL | 0 refills | Status: AC | PRN

## 2021-07-28 MED ORDER — MIDAZOLAM HCL 2 MG/2ML IJ SOLN
INTRAMUSCULAR | Status: AC
  Filled 2021-07-28: qty 2

## 2021-07-28 MED ORDER — SODIUM CHLORIDE (PF) 0.9 % IJ SOLN
INTRAMUSCULAR | Status: AC
  Filled 2021-07-28: qty 50

## 2021-07-28 MED ORDER — ONDANSETRON HCL 4 MG/2ML IJ SOLN
INTRAMUSCULAR | Status: AC
  Filled 2021-07-28: qty 2

## 2021-07-28 MED ORDER — CEFAZOLIN SODIUM-DEXTROSE 2-4 GM/100ML-% IV SOLN
INTRAVENOUS | Status: AC
  Filled 2021-07-28: qty 100

## 2021-07-28 MED ORDER — FENTANYL CITRATE (PF) 100 MCG/2ML IJ SOLN
INTRAMUSCULAR | Status: DC | PRN
  Administered 2021-07-28 (×2): 50 ug via INTRAVENOUS

## 2021-07-28 MED ORDER — SODIUM CHLORIDE 0.9 % IR SOLN
Status: DC | PRN
  Administered 2021-07-28: 3000 mL
  Administered 2021-07-28 (×2): 1000 mL

## 2021-07-28 MED ORDER — KETOROLAC TROMETHAMINE 30 MG/ML IJ SOLN
INTRAMUSCULAR | Status: DC | PRN
Start: 2021-07-28 — End: 2021-07-28
  Administered 2021-07-28: 30 mg via INTRA_ARTICULAR

## 2021-07-28 MED ORDER — BUPIVACAINE-EPINEPHRINE 0.25% -1:200000 IJ SOLN
INTRAMUSCULAR | Status: DC | PRN
  Administered 2021-07-28: 30 mL

## 2021-07-28 MED ORDER — HYDROCODONE-ACETAMINOPHEN 10-325 MG PO TABS: 1.0000 | ORAL_TABLET | Freq: Four times a day (QID) | ORAL | 0 refills | Status: AC | PRN

## 2021-07-28 MED ORDER — BUPIVACAINE IN DEXTROSE 0.75-8.25 % IT SOLN
INTRATHECAL | Status: DC | PRN
  Administered 2021-07-28: 1.8 mL via INTRATHECAL

## 2021-07-28 MED ORDER — PROPOFOL 500 MG/50ML IV EMUL
INTRAVENOUS | Status: AC
  Filled 2021-07-28: qty 50

## 2021-07-28 MED ORDER — POLYETHYLENE GLYCOL 3350 17 G PO PACK: 17.0000 g | PACK | Freq: Every day | ORAL | 0 refills | Status: AC | PRN

## 2021-07-28 MED ORDER — WATER FOR IRRIGATION, STERILE IR SOLN
Status: DC | PRN
Start: 2021-07-28 — End: 2021-07-28
  Administered 2021-07-28: 2000 mL

## 2021-07-28 MED ORDER — TRANEXAMIC ACID-NACL 1000-0.7 MG/100ML-% IV SOLN
1000.0000 mg | INTRAVENOUS | Status: AC
  Administered 2021-07-28: 1000 mg via INTRAVENOUS
  Filled 2021-07-28: qty 100

## 2021-07-28 MED ORDER — ISOPROPYL ALCOHOL 70 % SOLN
Status: AC
  Filled 2021-07-28: qty 480

## 2021-07-28 MED ORDER — CEFAZOLIN SODIUM-DEXTROSE 2-4 GM/100ML-% IV SOLN
2.0000 g | INTRAVENOUS | Status: AC
  Administered 2021-07-28: 2 g via INTRAVENOUS
  Filled 2021-07-28: qty 100

## 2021-07-28 MED ORDER — SODIUM CHLORIDE (PF) 0.9 % IJ SOLN
INTRAMUSCULAR | Status: DC | PRN
  Administered 2021-07-28: 30 mL

## 2021-07-28 MED ORDER — AMISULPRIDE (ANTIEMETIC) 5 MG/2ML IV SOLN: 10.0000 mg | Freq: Once | INTRAVENOUS | Status: DC | PRN

## 2021-07-28 MED ORDER — CHLORHEXIDINE GLUCONATE 0.12 % MT SOLN
15.0000 mL | Freq: Once | OROMUCOSAL | Status: AC
  Administered 2021-07-28: 15 mL via OROMUCOSAL

## 2021-07-28 MED ORDER — POVIDONE-IODINE 10 % EX SWAB: 2.0000 "application " | Freq: Once | CUTANEOUS | Status: DC

## 2021-07-28 MED ORDER — OXYCODONE HCL 5 MG/5ML PO SOLN: 5.0000 mg | Freq: Once | ORAL | Status: AC | PRN

## 2021-07-28 MED ORDER — ISOPROPYL ALCOHOL 70 % SOLN
Status: DC | PRN
Start: 2021-07-28 — End: 2021-07-28
  Administered 2021-07-28: 1 via TOPICAL

## 2021-07-28 MED ORDER — ASPIRIN 81 MG PO CHEW: 81.0000 mg | CHEWABLE_TABLET | Freq: Two times a day (BID) | ORAL | 0 refills | Status: AC

## 2021-07-28 MED ORDER — FENTANYL CITRATE (PF) 100 MCG/2ML IJ SOLN
INTRAMUSCULAR | Status: AC
  Filled 2021-07-28: qty 2

## 2021-07-28 MED ORDER — LACTATED RINGERS IV BOLUS
250.0000 mL | Freq: Once | INTRAVENOUS | Status: AC
  Administered 2021-07-28: 250 mL via INTRAVENOUS

## 2021-07-28 MED ORDER — ONDANSETRON HCL 4 MG/2ML IJ SOLN
INTRAMUSCULAR | Status: DC | PRN
  Administered 2021-07-28: 4 mg via INTRAVENOUS

## 2021-07-28 MED ORDER — PROPOFOL 1000 MG/100ML IV EMUL
INTRAVENOUS | Status: AC
  Filled 2021-07-28: qty 200

## 2021-07-28 MED ORDER — LACTATED RINGERS IV SOLN: INTRAVENOUS | Status: DC

## 2021-07-28 MED ORDER — PROPOFOL 10 MG/ML IV BOLUS
INTRAVENOUS | Status: DC | PRN
  Administered 2021-07-28: 30 mg via INTRAVENOUS

## 2021-07-28 MED ORDER — PROPOFOL 1000 MG/100ML IV EMUL
INTRAVENOUS | Status: AC
  Filled 2021-07-28: qty 100

## 2021-07-28 MED ORDER — PHENYLEPHRINE HCL-NACL 20-0.9 MG/250ML-% IV SOLN
INTRAVENOUS | Status: DC | PRN
  Administered 2021-07-28: 35 ug/min via INTRAVENOUS

## 2021-07-28 MED ORDER — ORAL CARE MOUTH RINSE: 15.0000 mL | Freq: Once | OROMUCOSAL | Status: AC

## 2021-07-28 MED ORDER — MIDAZOLAM HCL 5 MG/5ML IJ SOLN
INTRAMUSCULAR | Status: DC | PRN
  Administered 2021-07-28: 2 mg via INTRAVENOUS

## 2021-07-28 MED ORDER — BUPIVACAINE-EPINEPHRINE (PF) 0.25% -1:200000 IJ SOLN
INTRAMUSCULAR | Status: AC
  Filled 2021-07-28: qty 30

## 2021-07-28 MED ORDER — DOCUSATE SODIUM 100 MG PO CAPS: 100.0000 mg | ORAL_CAPSULE | Freq: Two times a day (BID) | ORAL | 0 refills | Status: AC

## 2021-07-28 MED ORDER — CEFAZOLIN SODIUM-DEXTROSE 2-4 GM/100ML-% IV SOLN
2.0000 g | Freq: Four times a day (QID) | INTRAVENOUS | Status: DC
  Administered 2021-07-28: 2 g via INTRAVENOUS

## 2021-07-28 MED ORDER — KETOROLAC TROMETHAMINE 30 MG/ML IJ SOLN
INTRAMUSCULAR | Status: AC
  Filled 2021-07-28: qty 1

## 2021-07-28 MED ORDER — OXYCODONE HCL 5 MG PO TABS
5.0000 mg | ORAL_TABLET | Freq: Once | ORAL | Status: AC | PRN
  Administered 2021-07-28: 5 mg via ORAL

## 2021-07-28 MED ORDER — ONDANSETRON HCL 4 MG/2ML IJ SOLN: 4.0000 mg | Freq: Once | INTRAMUSCULAR | Status: DC | PRN

## 2021-07-28 MED ORDER — OXYCODONE HCL 5 MG PO TABS
ORAL_TABLET | ORAL | Status: AC
  Filled 2021-07-28: qty 1

## 2021-07-28 MED ORDER — ONDANSETRON HCL 4 MG PO TABS: 4.0000 mg | ORAL_TABLET | Freq: Three times a day (TID) | ORAL | 0 refills | Status: AC | PRN

## 2021-07-28 MED ORDER — POVIDONE-IODINE 10 % EX SWAB
2.0000 "application " | Freq: Once | CUTANEOUS | Status: AC
  Administered 2021-07-28: 2 via TOPICAL

## 2021-07-28 MED ORDER — PROPOFOL 500 MG/50ML IV EMUL
INTRAVENOUS | Status: DC | PRN
  Administered 2021-07-28: 100 ug/kg/min via INTRAVENOUS

## 2021-07-28 SURGICAL SUPPLY — 59 items
BAG COUNTER SPONGE SURGICOUNT (BAG) IMPLANT
BAG DECANTER FOR FLEXI CONT (MISCELLANEOUS) IMPLANT
BAG ZIPLOCK 12X15 (MISCELLANEOUS) IMPLANT
CHLORAPREP W/TINT 26 (MISCELLANEOUS) ×2 IMPLANT
COVER PERINEAL POST (MISCELLANEOUS) ×2 IMPLANT
COVER SURGICAL LIGHT HANDLE (MISCELLANEOUS) ×2 IMPLANT
DERMABOND ADVANCED (GAUZE/BANDAGES/DRESSINGS) ×1
DERMABOND ADVANCED .7 DNX12 (GAUZE/BANDAGES/DRESSINGS) ×2 IMPLANT
DRAPE IMP U-DRAPE 54X76 (DRAPES) ×2 IMPLANT
DRAPE SHEET LG 3/4 BI-LAMINATE (DRAPES) ×6 IMPLANT
DRAPE STERI IOBAN 125X83 (DRAPES) ×2 IMPLANT
DRAPE U-SHAPE 47X51 STRL (DRAPES) ×4 IMPLANT
DRSG AQUACEL AG ADV 3.5X10 (GAUZE/BANDAGES/DRESSINGS) ×2 IMPLANT
ELECT REM PT RETURN 15FT ADLT (MISCELLANEOUS) ×2 IMPLANT
GAUZE SPONGE 4X4 12PLY STRL (GAUZE/BANDAGES/DRESSINGS) ×2 IMPLANT
GLOVE BIO SURGEON STRL SZ8.5 (GLOVE) ×4 IMPLANT
GLOVE BIOGEL M 7.0 STRL (GLOVE) ×2 IMPLANT
GLOVE BIOGEL PI IND STRL 7.5 (GLOVE) ×1 IMPLANT
GLOVE BIOGEL PI IND STRL 8 (GLOVE) ×1 IMPLANT
GLOVE BIOGEL PI IND STRL 8.5 (GLOVE) ×1 IMPLANT
GLOVE BIOGEL PI INDICATOR 7.5 (GLOVE) ×1
GLOVE BIOGEL PI INDICATOR 8 (GLOVE) ×1
GLOVE BIOGEL PI INDICATOR 8.5 (GLOVE) ×1
GLOVE SURG LX 7.5 STRW (GLOVE) ×2
GLOVE SURG LX STRL 7.5 STRW (GLOVE) ×2 IMPLANT
GOWN SPEC L3 XXLG W/TWL (GOWN DISPOSABLE) ×4 IMPLANT
HANDPIECE INTERPULSE COAX TIP (DISPOSABLE) ×1
HEAD CERAMIC BIOLOX 36 T1 STD (Head) ×1 IMPLANT
HOLDER FOLEY CATH W/STRAP (MISCELLANEOUS) ×2 IMPLANT
HOOD PEEL AWAY FLYTE STAYCOOL (MISCELLANEOUS) ×6 IMPLANT
KIT TURNOVER KIT A (KITS) IMPLANT
LINER ACETAB NN G7 F 36 (Liner) ×1 IMPLANT
MANIFOLD NEPTUNE II (INSTRUMENTS) ×2 IMPLANT
MARKER SKIN DUAL TIP RULER LAB (MISCELLANEOUS) ×2 IMPLANT
NDL SAFETY ECLIPSE 18X1.5 (NEEDLE) ×1 IMPLANT
NDL SPNL 18GX3.5 QUINCKE PK (NEEDLE) ×1 IMPLANT
NEEDLE HYPO 18GX1.5 SHARP (NEEDLE) ×1
NEEDLE SPNL 18GX3.5 QUINCKE PK (NEEDLE) ×2 IMPLANT
PACK ANTERIOR HIP CUSTOM (KITS) ×2 IMPLANT
PENCIL SMOKE EVACUATOR (MISCELLANEOUS) IMPLANT
SAW OSC TIP CART 19.5X105X1.3 (SAW) ×2 IMPLANT
SEALER BIPOLAR AQUA 6.0 (INSTRUMENTS) ×2 IMPLANT
SET HNDPC FAN SPRY TIP SCT (DISPOSABLE) ×1 IMPLANT
SHELL ACET G7 4H 56 SZF (Shell) ×1 IMPLANT
SOLUTION PRONTOSAN WOUND 350ML (IRRIGATION / IRRIGATOR) ×2 IMPLANT
SPIKE FLUID TRANSFER (MISCELLANEOUS) ×2 IMPLANT
STAPLER INSORB 30 2030 C-SECTI (MISCELLANEOUS) IMPLANT
STEM FEM CEMTL 133D 14X113X196 (Stem) ×1 IMPLANT
SUT MNCRL AB 3-0 PS2 18 (SUTURE) ×2 IMPLANT
SUT MON AB 2-0 CT1 36 (SUTURE) ×2 IMPLANT
SUT STRATAFIX PDO 1 14 VIOLET (SUTURE) ×1
SUT STRATFX PDO 1 14 VIOLET (SUTURE) ×1
SUT VIC AB 2-0 CT1 27 (SUTURE)
SUT VIC AB 2-0 CT1 TAPERPNT 27 (SUTURE) IMPLANT
SUTURE STRATFX PDO 1 14 VIOLET (SUTURE) ×1 IMPLANT
SYR 3ML LL SCALE MARK (SYRINGE) ×2 IMPLANT
TRAY FOLEY MTR SLVR 16FR STAT (SET/KITS/TRAYS/PACK) IMPLANT
TUBE SUCTION HIGH CAP CLEAR NV (SUCTIONS) ×2 IMPLANT
WATER STERILE IRR 1000ML POUR (IV SOLUTION) ×2 IMPLANT

## 2021-07-28 NOTE — Evaluation (Signed)
Physical Therapy Evaluation ?Patient Details ?Name: Brent Garrett ?MRN: [id number] ?DOB: 12/03/1960 ?Today's Date: 07/28/2021 ? ?History of Present Illness ? Pt is a 61yo male presenting s/p R-THA, AA on 07/28/21. PMH: AVN, OA, HLD, L-Uni knee 2013.  ?Clinical Impression ? Brent Garrett is a 61 y.o. male POD 0 s/p R-THA, AA. Patient reports IND with mobility at baseline. Patient is now limited by functional impairments (see PT problem list below) and requires min guard for transfers and gait with RW. Patient was able to ambulate 80 feet with RW and min guard and cues for safe walker management. Patient educated on safe sequencing for stair mobility and verbalized safe guarding position for people assisting with mobility. Patient instructed in exercises to facilitate ROM and circulation. Patient will benefit from continued skilled PT interventions to address impairments and progress towards PLOF. Patient has met mobility goals at adequate level for discharge home; will continue to follow if pt continues acute stay to progress towards Mod I goals.   ?   ? ?Recommendations for follow up therapy are one component of a multi-disciplinary discharge planning process, led by the attending physician.  Recommendations may be updated based on patient status, additional functional criteria and insurance authorization. ? ?Follow Up Recommendations Follow physician's recommendations for discharge plan and follow up therapies ? ?  ?Assistance Recommended at Discharge PRN  ?Patient can return home with the following ? A little help with walking and/or transfers;A little help with bathing/dressing/bathroom;Assistance with cooking/housework;Assist for transportation;Help with stairs or ramp for entrance ? ?  ?Equipment Recommendations Rolling walker (2 wheels)  ?Recommendations for Other Services ?    ?  ?Functional Status Assessment Patient has had a recent decline in their functional status and demonstrates the ability to make  significant improvements in function in a reasonable and predictable amount of time.  ? ?  ?Precautions / Restrictions Precautions ?Precautions: None ?Restrictions ?Weight Bearing Restrictions: No ?Other Position/Activity Restrictions: WBAT  ? ?  ? ?Mobility ? Bed Mobility ?Overal bed mobility: Needs Assistance ?Bed Mobility: Supine to Sit ?  ?  ?Supine to sit: Supervision ?  ?  ?General bed mobility comments: for safety only ?  ? ?Transfers ?Overall transfer level: Needs assistance ?Equipment used: Rolling walker (2 wheels) ?Transfers: Sit to/from Stand ?Sit to Stand: Min guard ?  ?  ?  ?  ?  ?General transfer comment: For safety only ?  ? ?Ambulation/Gait ?Ambulation/Gait assistance: Min guard ?Gait Distance (Feet): 80 Feet ?Assistive device: Rolling walker (2 wheels) ?Gait Pattern/deviations: Step-to pattern, Step-through pattern ?Gait velocity: decreased ?  ?  ?General Gait Details: Pt ambulated with RW and min guard assist, no physical assist required or overt LOB noted. Pt began with step-to pattern ,progressed to step-through. VCs for starting gait with RLE. ? ?Stairs ?Stairs: Yes ?Stairs assistance: Min guard ?Stair Management: Two rails, Step to pattern, Forwards ?Number of Stairs: 2 ?General stair comments: Pt educated on safe stair mobility and guarding, verbalized understanding. Demonstrated appropriate technique, VCs for sequencing, no overt LOB. ? ?Wheelchair Mobility ?  ? ?Modified Rankin (Stroke Patients Only) ?  ? ?  ? ?Balance Overall balance assessment: Needs assistance ?Sitting-balance support: Feet supported, No upper extremity supported ?Sitting balance-Leahy Scale: Good ?  ?  ?Standing balance support: Reliant on assistive device for balance, During functional activity, Bilateral upper extremity supported ?Standing balance-Leahy Scale: Poor ?  ?  ?  ?  ?  ?  ?  ?  ?  ?  ?  ?  ?   ? ? ? ?  Pertinent Vitals/Pain Pain Assessment ?Pain Assessment: 0-10 ?Pain Score: 7  ?Pain Location: R hip ?Pain  Descriptors / Indicators: Numbness, Discomfort ?Pain Intervention(s): Monitored during session, Repositioned, Ice applied  ? ? ?Home Living Family/patient expects to be discharged to:: Private residence ?Living Arrangements: Spouse/significant other ?Available Help at Discharge: Family;Available 24 hours/day ?Type of Home: House ?Home Access: Stairs to enter ?Entrance Stairs-Rails: Right;Left;Can reach both ?Entrance Stairs-Number of Steps: 3 ?  ?Home Layout: One level ?Home Equipment: Shower seat;Toilet riser ?   ?  ?Prior Function Prior Level of Function : Independent/Modified Independent ?  ?  ?  ?  ?  ?  ?Mobility Comments: ind ?ADLs Comments: ind ?  ? ? ?Hand Dominance  ?   ? ?  ?Extremity/Trunk Assessment  ? Upper Extremity Assessment ?Upper Extremity Assessment: Overall WFL for tasks assessed ?  ? ?Lower Extremity Assessment ?Lower Extremity Assessment: RLE deficits/detail;LLE deficits/detail ?RLE Deficits / Details: MMT ank PF/DF 5/5 ?RLE Sensation: WNL ?LLE Deficits / Details: MMT ank PF/DF 5/5 ?LLE Sensation: WNL ?  ? ?Cervical / Trunk Assessment ?Cervical / Trunk Assessment: Normal  ?Communication  ? Communication: No difficulties  ?Cognition Arousal/Alertness: Awake/alert ?Behavior During Therapy: [facility] for tasks assessed/performed ?Overall Cognitive Status: Within Functional Limits for tasks assessed ?  ?  ?  ?  ?  ?  ?  ?  ?  ?  ?  ?  ?  ?  ?  ?  ?  ?  ?  ? ?  ?General Comments   ? ?  ?Exercises Total Joint Exercises ?Ankle Circles/Pumps: AROM, Both, 10 reps ?Quad Sets: AROM, Both, 5 reps ?Short Arc Quad: AROM, Right, Other reps (comment) (2) ?Heel Slides: AROM, Right, Other reps (comment) (2) ?Hip ABduction/ADduction: AROM, Both (2)  ? ?Assessment/Plan  ?  ?PT Assessment Patient needs continued PT services  ?PT Problem List Decreased strength;Decreased range of motion;Decreased activity tolerance;Decreased balance;Decreased mobility;Decreased coordination;Decreased knowledge of use of DME;Pain ? ?   ?   ?PT Treatment Interventions DME instruction;Gait training;Stair training;Functional mobility training;Therapeutic activities;Therapeutic exercise;Balance training;Neuromuscular re-education;Patient/family education   ? ?PT Goals (Current goals can be found in the Care Plan section)  ?Acute Rehab PT Goals ?Patient Stated Goal: to stand and cook for longer time ?PT Goal Formulation: With patient ?Time For Goal Achievement: 08/04/21 ?Potential to Achieve Goals: Good ? ?  ?Frequency 7X/week ?  ? ? ?Co-evaluation   ?  ?  ?  ?  ? ? ?  ?AM-PAC PT "6 Clicks" Mobility  ?Outcome Measure Help needed turning from your back to your side while in a flat bed without using bedrails?: None ?Help needed moving from lying on your back to sitting on the side of a flat bed without using bedrails?: None ?Help needed moving to and from a bed to a chair (including a wheelchair)?: A Little ?Help needed standing up from a chair using your arms (e.g., wheelchair or bedside chair)?: A Little ?Help needed to walk in hospital room?: A Little ?Help needed climbing 3-5 steps with a railing? : A Little ?6 Click Score: 20 ? ?  ?End of Session Equipment Utilized During Treatment: Gait belt ?Activity Tolerance: Patient tolerated treatment well ?Patient left: in chair;with call bell/phone within reach;with nursing/sitter in room ?Nurse Communication: Mobility status ?PT Visit Diagnosis: Difficulty in walking, not elsewhere classified (R26.2) ?  ? ?Time: 1325-1350 ?PT Time Calculation (min) (ACUTE ONLY): 25 min ? ? ?Charges:   PT Evaluation ?$PT Eval Low Complexity: 1 Low ?PT Treatments ?$  Gait Training: 8-22 mins ?  ?   ? ? ?Coolidge Breeze, PT, DPT ?[facility] ?Office: [phone] ?Pager: [phone] ? ?Coolidge Breeze ?07/28/2021, 2:34 PM ?

## 2021-07-28 NOTE — Anesthesia Postprocedure Evaluation (Signed)
Anesthesia Post Note ? ?Patient: Brent Garrett ? ?Procedure(s) Performed: TOTAL HIP ARTHROPLASTY ANTERIOR APPROACH (Right: Hip) ? ?  ? ?Patient location during evaluation: Nursing Unit ?Anesthesia Type: Spinal ?Level of consciousness: oriented and awake and alert ?Pain management: pain level controlled ?Vital Signs Assessment: post-procedure vital signs reviewed and stable ?Respiratory status: spontaneous breathing and respiratory function stable ?Cardiovascular status: blood pressure returned to baseline and stable ?Postop Assessment: no headache, no backache, no apparent nausea or vomiting and patient able to bend at knees ?Anesthetic complications: no ? ? ?No notable events documented. ? ?Last Vitals:  ?Vitals:  ? 07/28/21 1200 07/28/21 1202  ?BP:    ?Pulse: 71   ?Resp:  16  ?Temp:    ?SpO2: 95%   ?  ?Last Pain:  ?Vitals:  ? 07/28/21 1202  ?TempSrc:   ?PainSc: 0-No pain  ? ? ?  ?  ?  ?  ?  ?  ? ?Trevor Iha ? ? ? ? ?

## 2021-07-28 NOTE — Interval H&P Note (Signed)
History and Physical Interval Note: ? ?07/28/2021 ?8:14 AM ? ?Brent Garrett  has presented today for surgery, with the diagnosis of Right hip avascular necrosis.  The various methods of treatment have been discussed with the patient and family. After consideration of risks, benefits and other options for treatment, the patient has consented to  Procedure(s): ?TOTAL HIP ARTHROPLASTY ANTERIOR APPROACH (Right) as a surgical intervention.  The patient's history has been reviewed, patient examined, no change in status, stable for surgery.  I have reviewed the patient's chart and labs.  Questions were answered to the patient's satisfaction.   ? ? ?Brent Garrett ? ? ?

## 2021-07-28 NOTE — Discharge Instructions (Signed)
? ?Dr. Brian Swinteck ?Joint Replacement Specialist ?[facility] ?[address] ?[place], [place] [address] ?[phone] ? ? ?TOTAL HIP REPLACEMENT POSTOPERATIVE DIRECTIONS ? ? ? ?Hip Rehabilitation, Guidelines Following Surgery  ? ?WEIGHT BEARING ?Weight bearing as tolerated with assist device (walker, cane, etc) as directed, use it as long as suggested by your surgeon or therapist, typically at least 4-6 weeks. ? ?The results of a hip operation are greatly improved after range of motion and muscle strengthening exercises. Follow all safety measures which are given to protect your hip. If any of these exercises cause increased pain or swelling in your joint, decrease the amount until you are comfortable again. Then slowly increase the exercises. Call your caregiver if you have problems or questions.  ? ?HOME CARE INSTRUCTIONS  ?Most of the following instructions are designed to prevent the dislocation of your new hip.  ?Remove items at home which could result in a fall. This includes throw rugs or furniture in walking pathways.  ?Continue medications as instructed at time of discharge. ?You may have some home medications which will be placed on hold until you complete the course of blood thinner medication. ?You may start showering once you are discharged home. Do not remove your dressing. ?Do not put on socks or shoes without following the instructions of your caregivers.   ?Sit on chairs with arms. Use the chair arms to help push yourself up when arising.  ?Arrange for the use of a toilet seat elevator so you are not sitting low.  ?Walk with walker as instructed.  ?You may resume a sexual relationship in one month or when given the OK by your caregiver.  ?Use walker as long as suggested by your caregivers.  ?You may put full weight on your legs and walk as much as is comfortable. ?Avoid periods of inactivity such as sitting longer than an hour when not asleep. This helps prevent blood  clots.  ?You may return to work once you are cleared by your surgeon.  ?Do not drive a car for 6 weeks or until released by your surgeon.  ?Do not drive while taking narcotics.  ?Wear elastic stockings for two weeks following surgery during the day but you may remove then at night.  ?Make sure you keep all of your appointments after your operation with all of your doctors and caregivers. You should call the office at the above phone number and make an appointment for approximately two weeks after the date of your surgery. ?Please pick up a stool softener and laxative for home use as long as you are requiring pain medications. ?ICE to the affected hip every three hours for 30 minutes at a time and then as needed for pain and swelling. Continue to use ice on the hip for pain and swelling from surgery. You may notice swelling that will progress down to the foot and ankle.  This is normal after surgery.  Elevate the leg when you are not up walking on it.   ?It is important for you to complete the blood thinner medication as prescribed by your doctor. ?Continue to use the breathing machine which will help keep your temperature down.  It is common for your temperature to cycle up and down following surgery, especially at night when you are not up moving around and exerting yourself.  The breathing machine keeps your lungs expanded and your temperature down. ? ?RANGE OF MOTION AND STRENGTHENING EXERCISES  ?These exercises are designed to help you   keep full movement of your hip joint. Follow your caregiver's or physical therapist's instructions. Perform all exercises about fifteen times, three times per day or as directed. Exercise both hips, even if you have had only one joint replacement. These exercises can be done on a training (exercise) mat, on the floor, on a table or on a bed. Use whatever works the best and is most comfortable for you. Use music or television while you are exercising so that the exercises are a  pleasant break in your day. This will make your life better with the exercises acting as a break in routine you can look forward to.  ?Lying on your back, slowly slide your foot toward your buttocks, raising your knee up off the floor. Then slowly slide your foot back down until your leg is straight again.  ?Lying on your back spread your legs as far apart as you can without causing discomfort.  ?Lying on your side, raise your upper leg and foot straight up from the floor as far as is comfortable. Slowly lower the leg and repeat.  ?Lying on your back, tighten up the muscle in the front of your thigh (quadriceps muscles). You can do this by keeping your leg straight and trying to raise your heel off the floor. This helps strengthen the largest muscle supporting your knee.  ?Lying on your back, tighten up the muscles of your buttocks both with the legs straight and with the knee bent at a comfortable angle while keeping your heel on the floor.  ? ?SKILLED REHAB INSTRUCTIONS: ?If the patient is transferred to a skilled rehab facility following release from the hospital, a list of the current medications will be sent to the facility for the patient to continue.  When discharged from the skilled rehab facility, please have the facility set up the patient's Home Health Physical Therapy prior to being released. Also, the skilled facility will be responsible for providing the patient with their medications at time of release from the facility to include their pain medication and their blood thinner medication. If the patient is still at the rehab facility at time of the two week follow up appointment, the skilled rehab facility will also need to assist the patient in arranging follow up appointment in our office and any transportation needs. ? ?POST-OPERATIVE OPIOID TAPER INSTRUCTIONS: ?It is important to wean off of your opioid medication as soon as possible. If you do not need pain medication after your surgery it is ok  to stop day one. ?Opioids include: ?Codeine, Hydrocodone(Norco, Vicodin), Oxycodone(Percocet, oxycontin) and hydromorphone amongst others.  ?Long term and even short term use of opiods can cause: ?Increased pain response ?Dependence ?Constipation ?Depression ?Respiratory depression ?And more.  ?Withdrawal symptoms can include ?Flu like symptoms ?Nausea, vomiting ?And more ?Techniques to manage these symptoms ?Hydrate well ?Eat regular healthy meals ?Stay active ?Use relaxation techniques(deep breathing, meditating, yoga) ?Do Not substitute Alcohol to help with tapering ?If you have been on opioids for less than two weeks and do not have pain than it is ok to stop all together.  ?Plan to wean off of opioids ?This plan should start within one week post op of your joint replacement. ?Maintain the same interval or time between taking each dose and first decrease the dose.  ?Cut the total daily intake of opioids by one tablet each day ?Next start to increase the time between doses. ?The last dose that should be eliminated is the evening dose.  ? ? ?MAKE   SURE YOU:  ?Understand these instructions.  ?Will watch your condition.  ?Will get help right away if you are not doing well or get worse. ? ?Pick up stool softner and laxative for home use following surgery while on pain medications. ?Do not remove your dressing. ?The dressing is waterproof--it is OK to take showers. ?Continue to use ice for pain and swelling after surgery. ?Do not use any lotions or creams on the incision until instructed by your surgeon. ?Total Hip Protocol. ? ?

## 2021-07-28 NOTE — Anesthesia Procedure Notes (Signed)
Procedure Name: [place] ?Date/Time: 07/28/2021 8:28 AM ?Performed by: Maxwell Caul, CRNA ?Pre-anesthesia Checklist: Patient identified, Emergency Drugs available, Suction available and Patient being monitored ?Oxygen Delivery Method: Simple face mask ? ? ? ? ?

## 2021-07-28 NOTE — Anesthesia Procedure Notes (Addendum)
Spinal ? ?Patient location during procedure: OB ?Start time: 07/28/2021 8:27 AM ?End time: 07/28/2021 8:33 AM ?Reason for block: surgical anesthesia ?Staffing ?Performed: resident/CRNA  ?Anesthesiologist: Trevor Iha, MD ?Resident/CRNA: Orest Dikes, CRNA ?Preanesthetic Checklist ?Completed: patient identified, IV checked, risks and benefits discussed, surgical consent, monitors and equipment checked, pre-op evaluation and timeout performed ?Spinal Block ?Patient position: sitting ?Prep: DuraPrep and site prepped and draped ?Patient monitoring: heart rate, cardiac monitor, continuous pulse ox and blood pressure ?Approach: midline ?Location: L3-4 ?Injection technique: single-shot ?Needle ?Needle type: Pencan  ?Needle gauge: 24 G ?Needle length: 10 cm ?Needle insertion depth: 6 cm ?Assessment ?Sensory level: T4 ?Events: CSF return ?Additional Notes ? 1 Attempt (s). Pt tolerated procedure well. ? ? ? ?

## 2021-07-28 NOTE — Op Note (Signed)
OPERATIVE REPORT ? ?SURGEON: Rod Can, MD  ? ?ASSISTANT: Larene Pickett, PA-C. ? ?PREOPERATIVE DIAGNOSIS: Right hip avascular necrosis.  ? ?POSTOPERATIVE DIAGNOSIS: Right hip avascular necrosis.  ? ?PROCEDURE: Right total hip arthroplasty, anterior approach.  ? ?IMPLANTS: Biomet Taperloc Complete Microplasty stem, size 14 x 113, high offset. ?Biomet G7 OsseoTi Cup, size 56 mm. ?Biomet Vivacit-E liner, size 36 mm, F, neutral. ?Biomet Biolox ceramic head ball, size 36 + 0 mm. ? ?ANESTHESIA:  MAC and Spinal ? ?ESTIMATED BLOOD LOSS:-200 mL   ? ?ANTIBIOTICS: 2 g Ancef. ? ?DRAINS: None. ? ?COMPLICATIONS: None. ?  ?CONDITION: PACU - hemodynamically stable.  ? ?BRIEF CLINICAL NOTE: RAMESH MOAN is a 61 y.o. male with a long-standing history of Right hip avascular necrosis with collapse. After failing conservative management, the patient was indicated for total hip arthroplasty. The risks, benefits, and alternatives to the procedure were explained, and the patient elected to proceed. ? ?PROCEDURE IN DETAIL: Surgical site was marked by myself in the pre-op holding area. Once inside the operating room, spinal anesthesia was obtained, and a foley catheter was inserted. The patient was then positioned on the Hana table.  All bony prominences were well padded.  The hip was prepped and draped in the normal sterile surgical fashion.  A time-out was called verifying side and site of surgery. The patient received IV antibiotics within 60 minutes of beginning the procedure. ?  ?Bikini incision was made, and superficial dissection was performed lateral to the ASIS. The direct anterior approach to the hip was performed through the Hueter interval.  Lateral femoral circumflex vessels were treated with the Auqumantys. The anterior capsule was exposed and an inverted T capsulotomy was made. The femoral neck cut was made to the level of the templated cut.  A corkscrew was placed into the head and the head was removed.  The femoral  head was found to have delaminated cartilage and eburnated bone. The head was passed to the back table and was measured. Pubofemoral ligament was released off of the calcar, taking care to stay on bone. Superior capsule was released from the greater trochanter, taking care to stay lateral to the posterior border of the femoral neck in order to preserve the short external rotators. ?  ?Acetabular exposure was achieved, and the pulvinar and labrum were excised. Sequential reaming of the acetabulum was then performed up to a size 55 mm reamer. A 56 mm cup was then opened and impacted into place at approximately 40 degrees of abduction and 20 degrees of anteversion. The final polyethylene liner was impacted into place and acetabular osteophytes were removed.  ?  ?I then gained femoral exposure taking care to protect the abductors and greater trochanter.  This was performed using standard external rotation, extension, and adduction.  A cookie cutter was used to enter the femoral canal, and then the femoral canal finder was placed.  Sequential broaching was performed up to a size 14.  Calcar planer was used on the femoral neck remnant.  I placed a high offset neck and a trial head ball.  The hip was reduced.  Leg lengths and offset were checked fluoroscopically.  The hip was dislocated and trial components were removed.  The final implants were placed, and the hip was reduced.  Fluoroscopy was used to confirm component position and leg lengths.  At 90 degrees of external rotation and full extension, the hip was stable to an anterior directed force. ?  ?The wound was copiously irrigated with Prontosan  solution and normal saline using pule lavage.  Marcaine solution was injected into the periarticular soft tissue.  The wound was closed in layers using #1 Vicryl and V-Loc for the fascia, 2-0 Vicryl for the subcutaneous fat, 2-0 Monocryl for the deep dermal layer, 3-0 running Monocryl subcuticular stitch, and Dermabond for  the skin.  Once the glue was fully dried, an Aquacell Ag dressing was applied.  The patient was transported to the recovery room in stable condition.  Sponge, needle, and instrument counts were correct at the end of the case x2.  The patient tolerated the procedure well and there were no known complications. ? ?Please note that a surgical assistant was a medical necessity for this procedure to perform it in a safe and expeditious manner. Assistant was necessary to provide appropriate retraction of vital neurovascular structures, to prevent femoral fracture, and to allow for anatomic placement of the prosthesis. ?

## 2021-07-28 NOTE — Transfer of Care (Signed)
Immediate Anesthesia Transfer of Care Note ? ?Patient: Brent Garrett ? ?Procedure(s) Performed: TOTAL HIP ARTHROPLASTY ANTERIOR APPROACH (Right: Hip) ? ?Patient Location: PACU ? ?Anesthesia Type:Spinal ? ?Level of Consciousness: awake, alert  and oriented ? ?Airway & Oxygen Therapy: Patient Spontanous Breathing and Patient connected to face mask oxygen ? ?Post-op Assessment: Report given to RN and Post -op Vital signs reviewed and stable ? ?Post vital signs: Reviewed and stable ? ?Last Vitals:  ?Vitals Value Taken Time  ?BP    ?Temp    ?Pulse    ?Resp    ?SpO2    ? ? ?Last Pain:  ?Vitals:  ? 07/28/21 0702  ?TempSrc: Oral  ?PainSc:   ?   ? ?Patients Stated Pain Goal: 5 (07/28/21 1914) ? ?Complications: No notable events documented. ?

## 2021-07-28 NOTE — Anesthesia Procedure Notes (Signed)
Spinal ? ?Patient location during procedure: OR ?End time: 07/28/2021 8:33 AM ?Reason for block: surgical anesthesia ?Staffing ?Performed: resident/CRNA  ?Anesthesiologist: Barnet Glasgow, MD ?Resident/CRNA: Maxwell Caul, CRNA ?Preanesthetic Checklist ?Completed: patient identified, IV checked, site marked, risks and benefits discussed, surgical consent, monitors and equipment checked, pre-op evaluation and timeout performed ?Spinal Block ?Patient position: sitting ?Prep: DuraPrep ?Patient monitoring: heart rate, cardiac monitor, continuous pulse ox and blood pressure ?Approach: midline ?Location: L3-4 ?Injection technique: single-shot ?Needle ?Needle type: Pencan  ?Needle gauge: 24 G ?Needle length: 10 cm ?Assessment ?Sensory level: T4 ?Events: CSF return ?Additional Notes ?IV functioning, monitors applied to pt. Expiration date of kit checked and confirmed to be in date. Sterile prep and drape, hand hygiene and sterile gloves used. Pt was positioned and spine was prepped in sterile fashion. Skin was anesthetized with lidocaine. Free flow of clear CSF obtained prior to injecting local anesthetic into CSF x 1 attempt. Spinal needle aspirated freely following injection. Needle was carefully withdrawn, and pt tolerated procedure well. Loss of motor and sensory on exam post injection. Dr Valma Cava at bedside during entire placement. ? ? ? ?

## 2021-07-30 ENCOUNTER — Encounter (HOSPITAL_COMMUNITY): Payer: Self-pay | Admitting: Orthopedic Surgery

## 2021-08-10 DIAGNOSIS — Z4789 Encounter for other orthopedic aftercare: Secondary | ICD-10-CM | POA: Diagnosis not present

## 2021-09-07 DIAGNOSIS — Z471 Aftercare following joint replacement surgery: Secondary | ICD-10-CM | POA: Diagnosis not present

## 2021-09-07 DIAGNOSIS — Z96641 Presence of right artificial hip joint: Secondary | ICD-10-CM | POA: Diagnosis not present

## 2021-10-15 DIAGNOSIS — M87051 Idiopathic aseptic necrosis of right femur: Secondary | ICD-10-CM | POA: Diagnosis not present

## 2021-10-15 DIAGNOSIS — M5136 Other intervertebral disc degeneration, lumbar region: Secondary | ICD-10-CM | POA: Diagnosis not present

## 2021-10-15 DIAGNOSIS — G8929 Other chronic pain: Secondary | ICD-10-CM | POA: Diagnosis not present

## 2021-10-15 DIAGNOSIS — M545 Low back pain, unspecified: Secondary | ICD-10-CM | POA: Diagnosis not present

## 2022-09-26 DIAGNOSIS — H35372 Puckering of macula, left eye: Secondary | ICD-10-CM | POA: Diagnosis not present

## 2023-04-08 IMAGING — RF DG HIP (WITH OR WITHOUT PELVIS) 1V*R*
1 series · 3 of 3 positions shown · non-contrast
Comparison: None Available.

CLINICAL DATA: Right anterior hip replacement.

EXAM:
DG HIP (WITH OR WITHOUT PELVIS) 1V RIGHT

[Series 1: unknown protocol · 0.20mm/px · 3 of 3 slices shown]
[im 1/3]
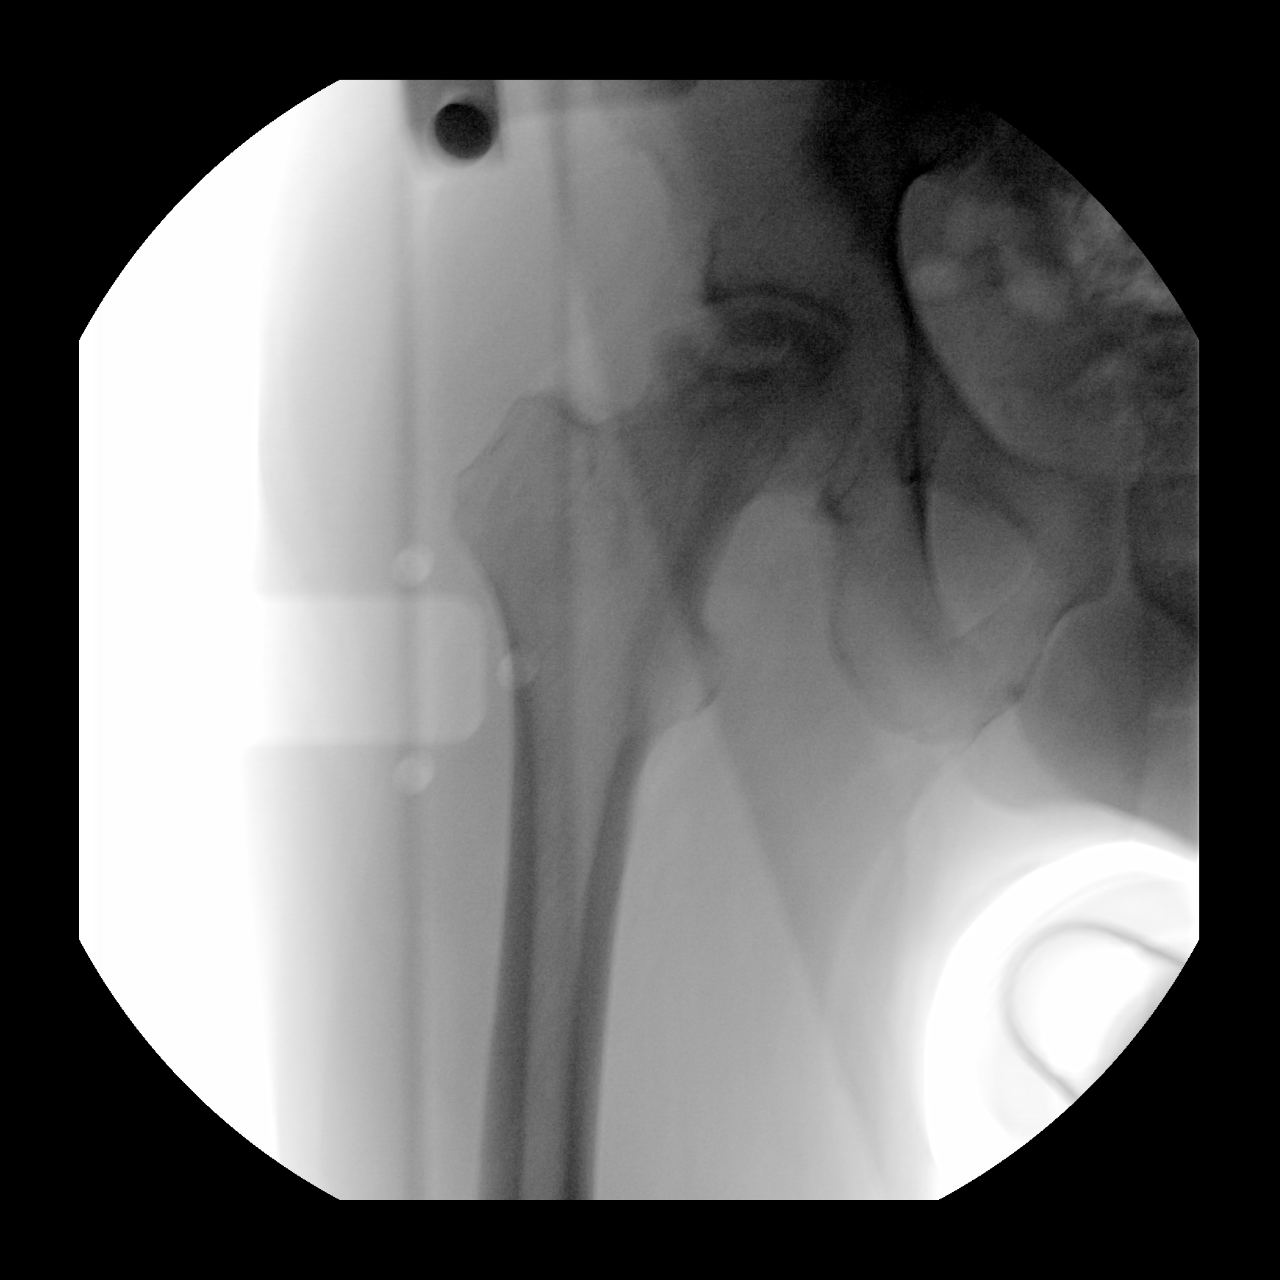
[im 2/3]
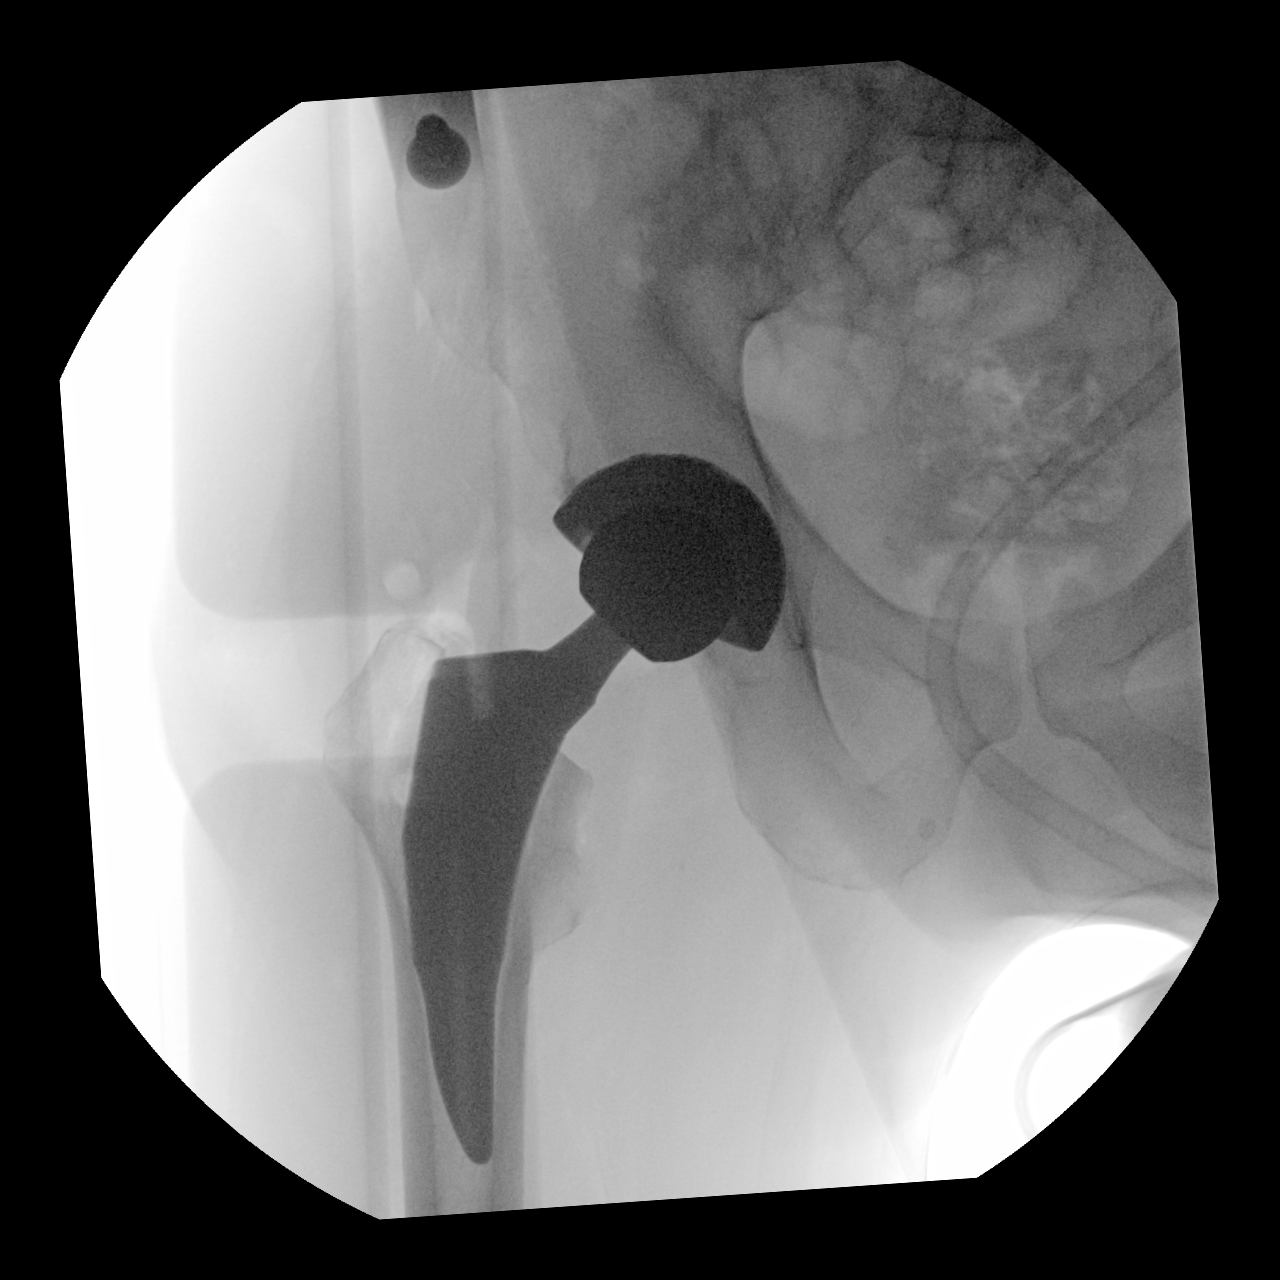
[im 3/3]
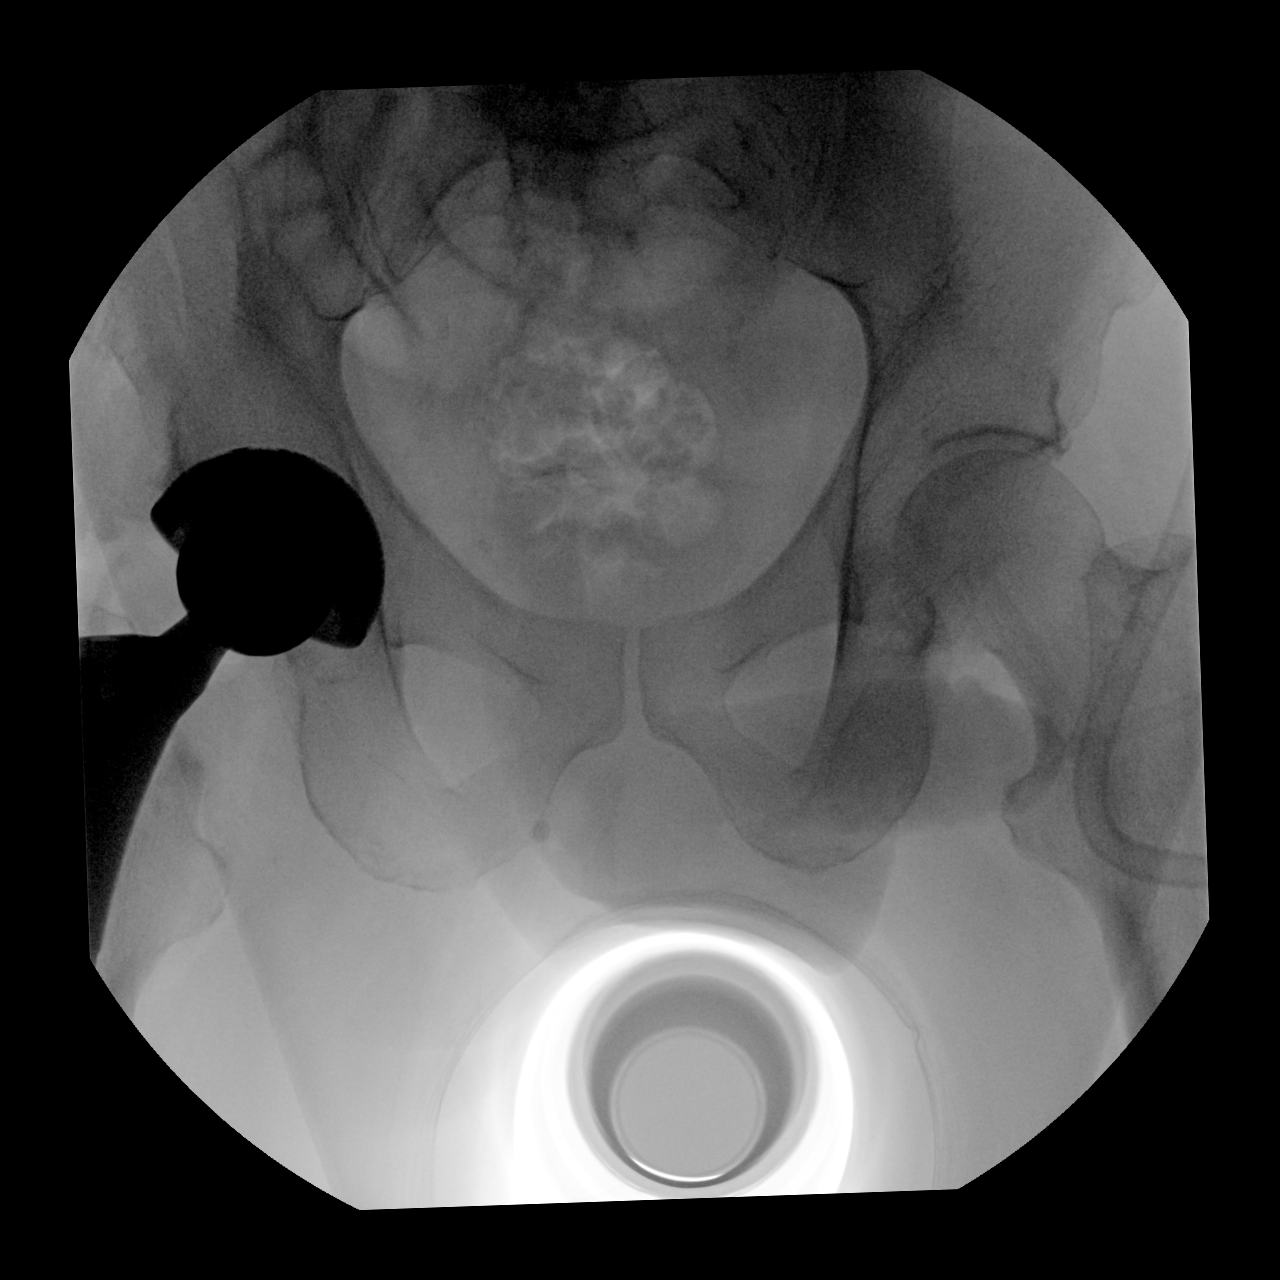

[3 of 3 positions shown; findings below may reference images not displayed]

FINDINGS: Reported cumulative air kerma: 1.24 mGy

Reported pedal time: 12 seconds

Three C-arm fluoroscopic images were obtained intraoperatively and
submitted for post operative interpretation. These images
demonstrate postsurgical changes of total right hip arthroplasty.
Please see the performing provider's procedural report for further
detail.
IMPRESSION: Intraoperative fluoroscopy, as detailed above.
